# Patient Record
Sex: Male | Born: 1935 | Race: White | Hispanic: No | Marital: Married | Smoking: Never smoker
Health system: Southern US, Community
[De-identification: ages and names within clinical notes are randomized; demographics above are authoritative.]

## PROBLEM LIST (undated history)

## (undated) DIAGNOSIS — R269 Unspecified abnormalities of gait and mobility: Secondary | ICD-10-CM

## (undated) DIAGNOSIS — G309 Alzheimer's disease, unspecified: Secondary | ICD-10-CM

## (undated) DIAGNOSIS — M545 Low back pain, unspecified: Secondary | ICD-10-CM

## (undated) DIAGNOSIS — G2401 Drug induced subacute dyskinesia: Secondary | ICD-10-CM

## (undated) DIAGNOSIS — G40909 Epilepsy, unspecified, not intractable, without status epilepticus: Secondary | ICD-10-CM

## (undated) DIAGNOSIS — M810 Age-related osteoporosis without current pathological fracture: Secondary | ICD-10-CM

## (undated) DIAGNOSIS — S82892A Other fracture of left lower leg, initial encounter for closed fracture: Secondary | ICD-10-CM

## (undated) DIAGNOSIS — Z87898 Personal history of other specified conditions: Secondary | ICD-10-CM

## (undated) DIAGNOSIS — G20A1 Parkinson's disease without dyskinesia, without mention of fluctuations: Secondary | ICD-10-CM

## (undated) DIAGNOSIS — Z8669 Personal history of other diseases of the nervous system and sense organs: Secondary | ICD-10-CM

## (undated) DIAGNOSIS — I639 Cerebral infarction, unspecified: Secondary | ICD-10-CM

## (undated) DIAGNOSIS — S2239XA Fracture of one rib, unspecified side, initial encounter for closed fracture: Secondary | ICD-10-CM

## (undated) DIAGNOSIS — S2249XA Multiple fractures of ribs, unspecified side, initial encounter for closed fracture: Secondary | ICD-10-CM

## (undated) DIAGNOSIS — Z8679 Personal history of other diseases of the circulatory system: Secondary | ICD-10-CM

## (undated) DIAGNOSIS — I10 Essential (primary) hypertension: Secondary | ICD-10-CM

## (undated) DIAGNOSIS — G20 Parkinson's disease: Secondary | ICD-10-CM

## (undated) DIAGNOSIS — F259 Schizoaffective disorder, unspecified: Secondary | ICD-10-CM

## (undated) DIAGNOSIS — G8929 Other chronic pain: Secondary | ICD-10-CM

## (undated) DIAGNOSIS — F0280 Dementia in other diseases classified elsewhere without behavioral disturbance: Secondary | ICD-10-CM

## (undated) DIAGNOSIS — E785 Hyperlipidemia, unspecified: Secondary | ICD-10-CM

## (undated) DIAGNOSIS — I2510 Atherosclerotic heart disease of native coronary artery without angina pectoris: Secondary | ICD-10-CM

## (undated) DIAGNOSIS — M1990 Unspecified osteoarthritis, unspecified site: Secondary | ICD-10-CM

## (undated) HISTORY — DX: Other fracture of left lower leg, initial encounter for closed fracture: S82.892A

## (undated) HISTORY — PX: TRANSURETHRAL RESECTION OF PROSTATE: SHX73

## (undated) HISTORY — DX: Other chronic pain: G89.29

## (undated) HISTORY — PX: ANKLE FRACTURE SURGERY: SHX122

## (undated) HISTORY — DX: Personal history of other diseases of the nervous system and sense organs: Z86.69

## (undated) HISTORY — DX: Hyperlipidemia, unspecified: E78.5

## (undated) HISTORY — PX: CARDIAC SURGERY: SHX584

## (undated) HISTORY — DX: Schizoaffective disorder, unspecified: F25.9

## (undated) HISTORY — DX: Drug induced subacute dyskinesia: G24.01

## (undated) HISTORY — DX: Personal history of other diseases of the circulatory system: Z86.79

## (undated) HISTORY — DX: Atherosclerotic heart disease of native coronary artery without angina pectoris: I25.10

## (undated) HISTORY — DX: Age-related osteoporosis without current pathological fracture: M81.0

## (undated) HISTORY — PX: ODONTOID FRACTURE SURGERY: SHX724

## (undated) HISTORY — DX: Fracture of one rib, unspecified side, initial encounter for closed fracture: S22.39XA

## (undated) HISTORY — PX: HERNIA REPAIR: SHX51

## (undated) HISTORY — DX: Multiple fractures of ribs, unspecified side, initial encounter for closed fracture: S22.49XA

## (undated) HISTORY — DX: Low back pain, unspecified: M54.50

## (undated) HISTORY — PX: GALLBLADDER SURGERY: SHX652

## (undated) HISTORY — DX: Unspecified abnormalities of gait and mobility: R26.9

## (undated) HISTORY — DX: Low back pain: M54.5

## (undated) HISTORY — DX: Personal history of other specified conditions: Z87.898

---

## 2002-04-08 ENCOUNTER — Encounter: Payer: Self-pay | Admitting: Emergency Medicine

## 2002-04-09 ENCOUNTER — Inpatient Hospital Stay (HOSPITAL_COMMUNITY): Admission: EM | Admit: 2002-04-09 | Discharge: 2002-04-10 | Payer: Self-pay | Admitting: Emergency Medicine

## 2005-11-27 ENCOUNTER — Encounter: Admission: RE | Admit: 2005-11-27 | Discharge: 2005-11-27 | Payer: Self-pay | Admitting: Neurology

## 2006-07-20 ENCOUNTER — Encounter: Admission: RE | Admit: 2006-07-20 | Discharge: 2006-07-20 | Payer: Self-pay | Admitting: Neurology

## 2008-02-24 ENCOUNTER — Inpatient Hospital Stay (HOSPITAL_COMMUNITY): Admission: EM | Admit: 2008-02-24 | Discharge: 2008-03-02 | Payer: Self-pay | Admitting: Emergency Medicine

## 2008-04-11 ENCOUNTER — Inpatient Hospital Stay (HOSPITAL_COMMUNITY): Admission: RE | Admit: 2008-04-11 | Discharge: 2008-04-13 | Payer: Self-pay | Admitting: Urology

## 2008-04-11 ENCOUNTER — Encounter (INDEPENDENT_AMBULATORY_CARE_PROVIDER_SITE_OTHER): Payer: Self-pay | Admitting: Urology

## 2009-04-13 IMAGING — CT CT HEAD W/O CM
1 of 2 series · 15 of 30 positions shown, 19 images · non-contrast
Comparison: None.

Addendum Begins

Follow-up MRI on 03/01/2008 demonstrated a small left subdural
hematoma.  In retrospect, there is a small amount of high density
along the left parietal convexity consistent with a small amount of
extra-axial blood.  Dr. Romy Violet Minit discussed this finding with Dr.
Garland following the MRI study.
Addendum Ends
CLINICAL DATA: Confusion and rule out bleed
CT HEAD WITHOUT CONTRAST
TECHNIQUE: Contiguous axial images were obtained from the base of
the skull through the vertex without contrast.

[Series 5: head trauma 2.4 h60s · axial · 0.49mm/px · z∈[-202,-74]mm · 15 of 60 slices shown, 19 images]
[im 4/60  brain]
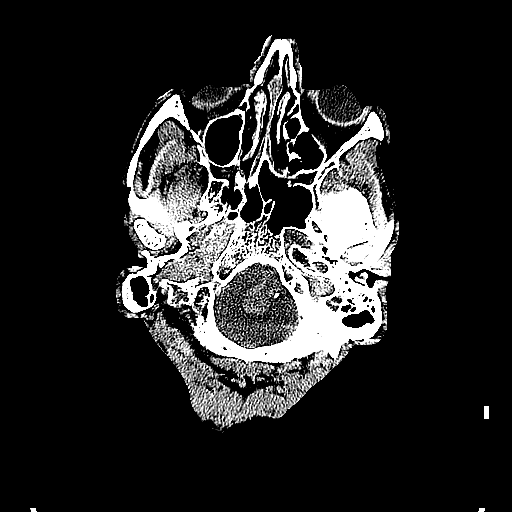
[im 4/60  bone]
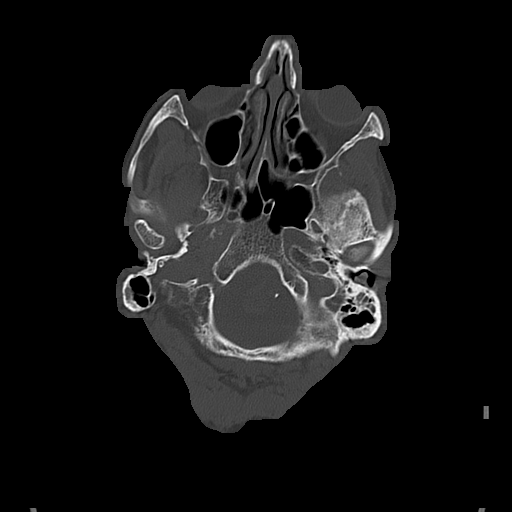
[im 7/60  brain]
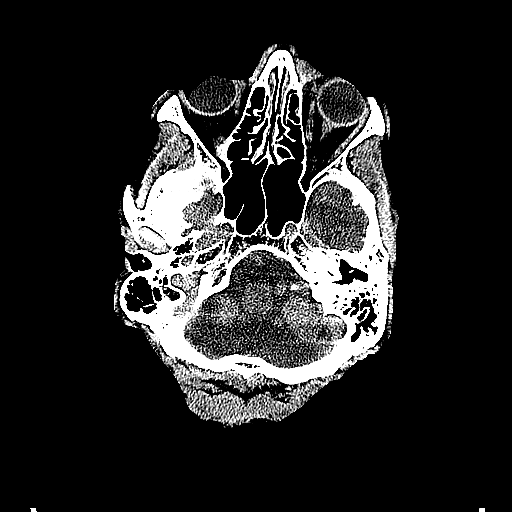
[im 10/60  brain]
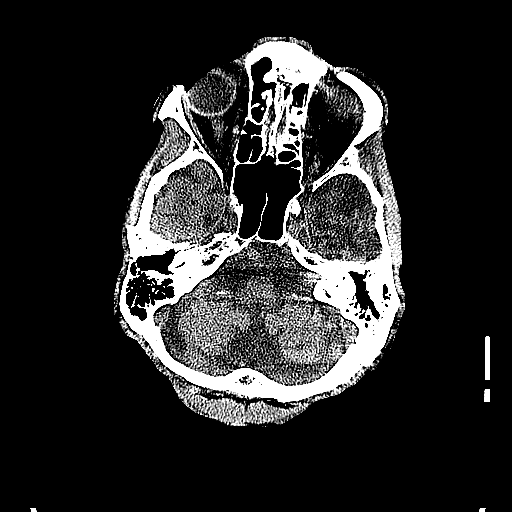
[im 13/60  brain]
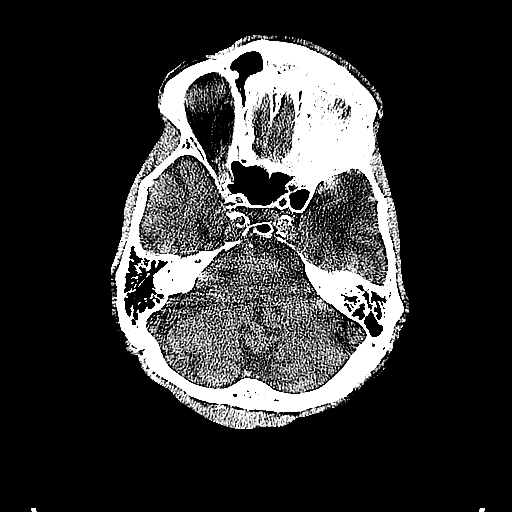
[im 19/60  brain]
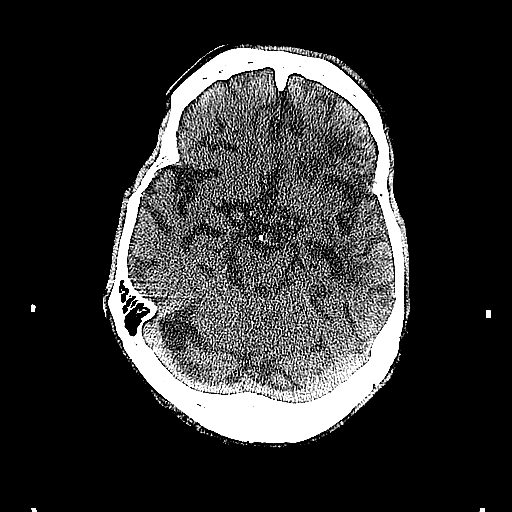
[im 19/60  bone]
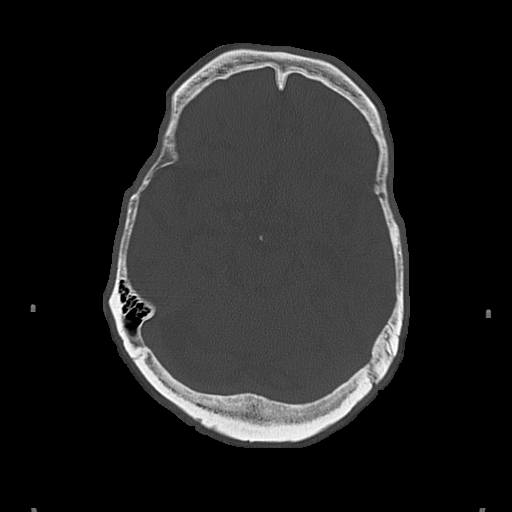
[im 22/60  brain]
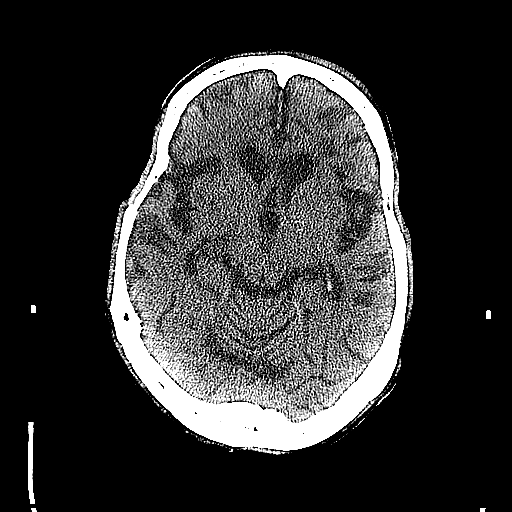
[im 25/60  brain]
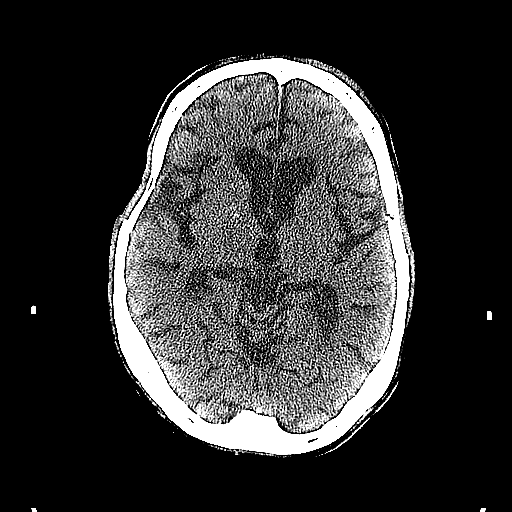
[im 28/60  brain]
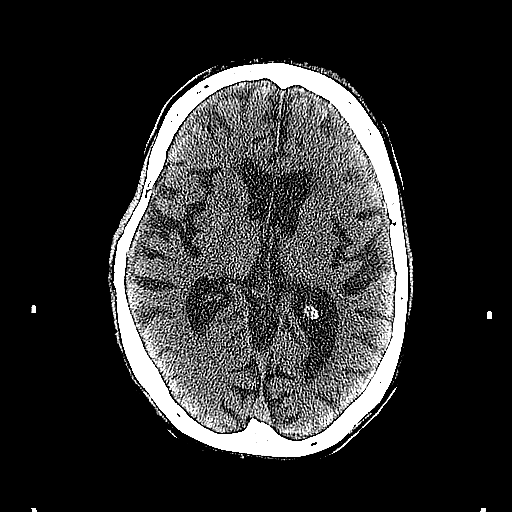
[im 32/60  brain]
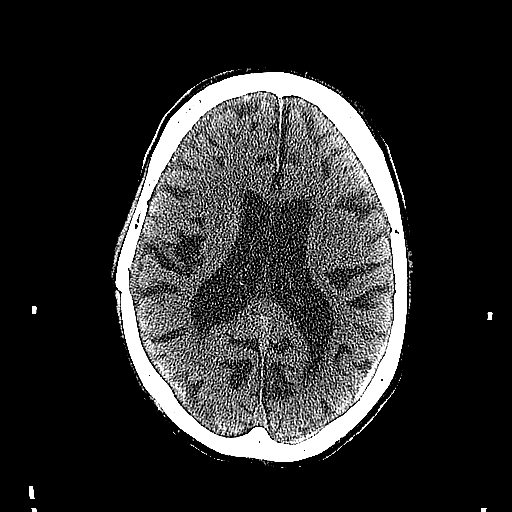
[im 32/60  bone]
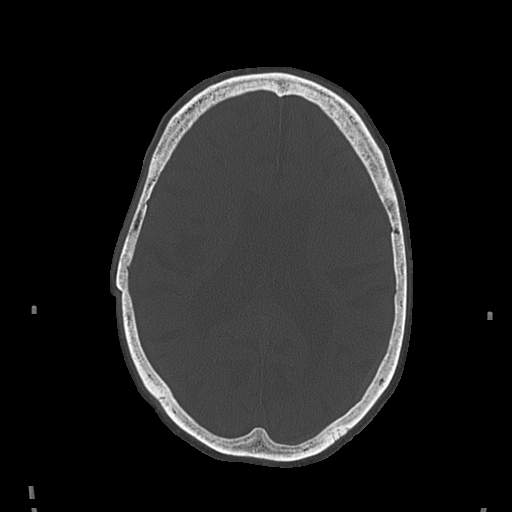
[im 35/60  brain]
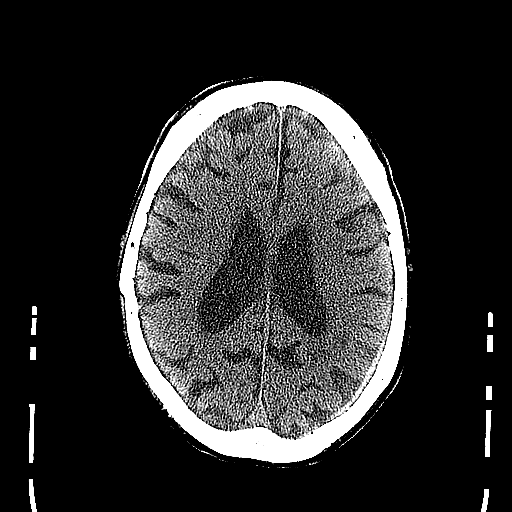
[im 38/60  brain]
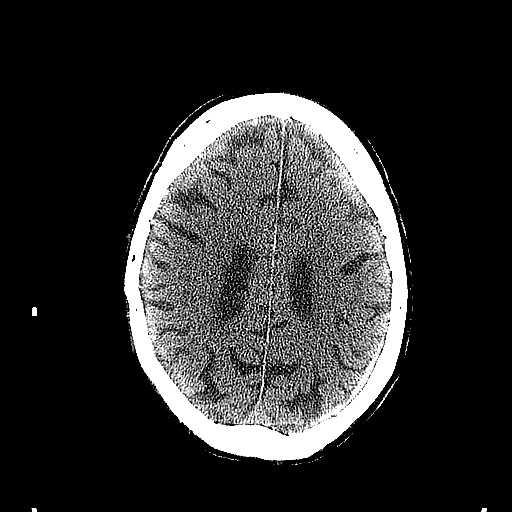
[im 44/60  brain]
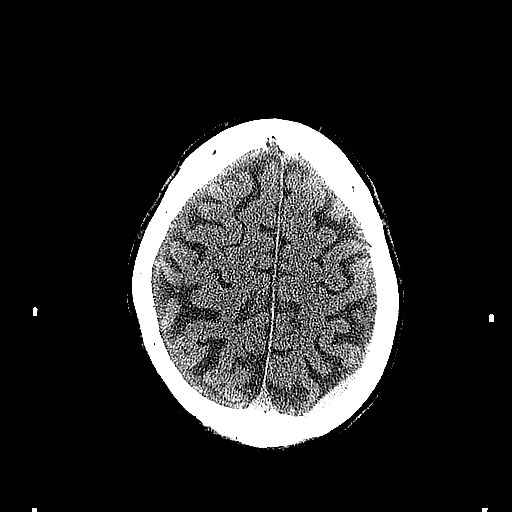
[im 47/60  brain]
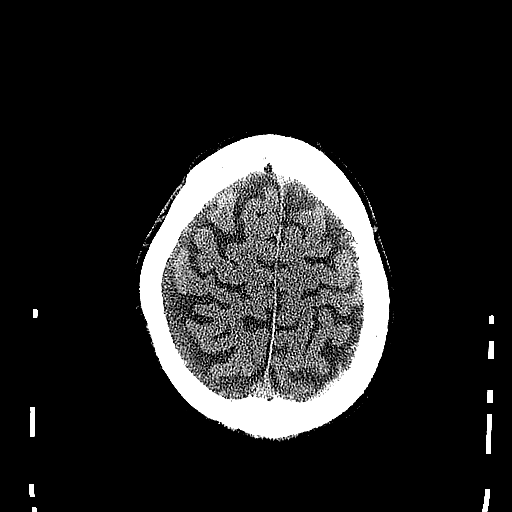
[im 47/60  bone]
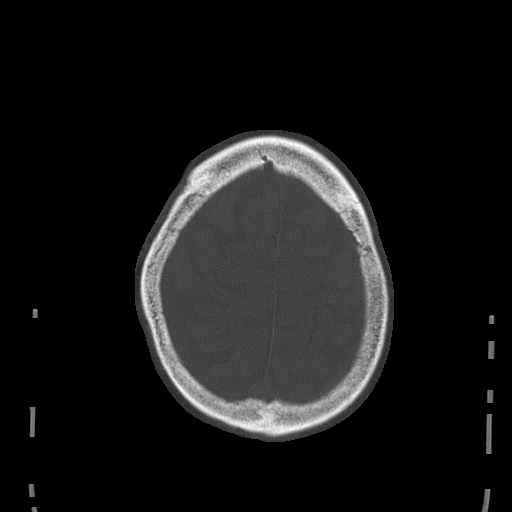
[im 53/60  brain]
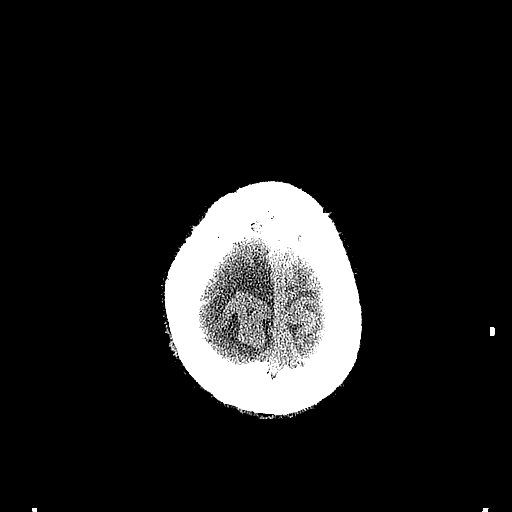
[im 56/60  brain]
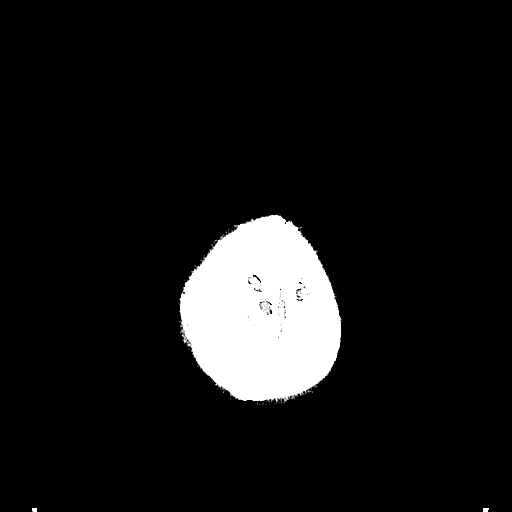

[15 of 30 positions shown; findings below may reference images not displayed]

FINDINGS: Diffuse cerebral atrophy and encephalomalacia in the left
temporal and inferior left frontal lobe.  Soft tissue swelling
along the left forehead.  No evidence for acute hemorrhage, mass
lesion, or midline shift.  Small amount of fluid in the left
mastoid air cells.  No acute calvarial fracture.  Low density along
the anterior limb of the right internal capsule and right corona
radiata may represent an insult of unknown age.
IMPRESSION: Diffuse cerebral atrophy with evidence for encephalomalacia and old
infarct involving the inferior left frontal lobe and the left
temporal lobe.

Negative for acute hemorrhage.

Probable insult involving the right corona radiata of unknown age.

## 2010-09-03 ENCOUNTER — Inpatient Hospital Stay (HOSPITAL_COMMUNITY): Admission: RE | Admit: 2010-09-03 | Discharge: 2010-09-10 | Payer: Self-pay | Admitting: Neurosurgery

## 2011-01-03 ENCOUNTER — Emergency Department (HOSPITAL_COMMUNITY): Payer: Medicare Other

## 2011-01-03 ENCOUNTER — Inpatient Hospital Stay (HOSPITAL_COMMUNITY)
Admission: EM | Admit: 2011-01-03 | Discharge: 2011-01-12 | DRG: 885 | Disposition: A | Discharge status: Home / Self Care | Payer: Medicare Other | Attending: Internal Medicine | Admitting: Internal Medicine

## 2011-01-03 DIAGNOSIS — Z7982 Long term (current) use of aspirin: Secondary | ICD-10-CM

## 2011-01-03 DIAGNOSIS — G40909 Epilepsy, unspecified, not intractable, without status epilepticus: Secondary | ICD-10-CM | POA: Diagnosis present

## 2011-01-03 DIAGNOSIS — M545 Low back pain, unspecified: Secondary | ICD-10-CM | POA: Diagnosis present

## 2011-01-03 DIAGNOSIS — R269 Unspecified abnormalities of gait and mobility: Secondary | ICD-10-CM | POA: Diagnosis present

## 2011-01-03 DIAGNOSIS — F200 Paranoid schizophrenia: Principal | ICD-10-CM | POA: Diagnosis present

## 2011-01-03 DIAGNOSIS — E785 Hyperlipidemia, unspecified: Secondary | ICD-10-CM | POA: Diagnosis present

## 2011-01-03 DIAGNOSIS — F079 Unspecified personality and behavioral disorder due to known physiological condition: Secondary | ICD-10-CM | POA: Diagnosis present

## 2011-01-03 DIAGNOSIS — F0390 Unspecified dementia without behavioral disturbance: Secondary | ICD-10-CM | POA: Diagnosis present

## 2011-01-03 DIAGNOSIS — I672 Cerebral atherosclerosis: Secondary | ICD-10-CM | POA: Diagnosis present

## 2011-01-03 DIAGNOSIS — K219 Gastro-esophageal reflux disease without esophagitis: Secondary | ICD-10-CM | POA: Diagnosis present

## 2011-01-03 DIAGNOSIS — Z951 Presence of aortocoronary bypass graft: Secondary | ICD-10-CM

## 2011-01-03 DIAGNOSIS — Z9849 Cataract extraction status, unspecified eye: Secondary | ICD-10-CM

## 2011-01-03 DIAGNOSIS — N179 Acute kidney failure, unspecified: Secondary | ICD-10-CM | POA: Diagnosis present

## 2011-01-03 DIAGNOSIS — I2510 Atherosclerotic heart disease of native coronary artery without angina pectoris: Secondary | ICD-10-CM | POA: Diagnosis present

## 2011-01-03 DIAGNOSIS — J9819 Other pulmonary collapse: Secondary | ICD-10-CM | POA: Diagnosis present

## 2011-01-03 DIAGNOSIS — E039 Hypothyroidism, unspecified: Secondary | ICD-10-CM | POA: Diagnosis present

## 2011-01-03 DIAGNOSIS — M810 Age-related osteoporosis without current pathological fracture: Secondary | ICD-10-CM | POA: Diagnosis present

## 2011-01-03 DIAGNOSIS — Z8673 Personal history of transient ischemic attack (TIA), and cerebral infarction without residual deficits: Secondary | ICD-10-CM

## 2011-01-03 LAB — CBC
HCT: 38 % — ABNORMAL LOW (ref 39.0–52.0)
Hemoglobin: 12.7 g/dL — ABNORMAL LOW (ref 13.0–17.0)
MCV: 94.3 fL (ref 78.0–100.0)
RBC: 4.03 MIL/uL — ABNORMAL LOW (ref 4.22–5.81)
WBC: 8.9 10*3/uL (ref 4.0–10.5)

## 2011-01-03 LAB — URINALYSIS, ROUTINE W REFLEX MICROSCOPIC
Bilirubin Urine: NEGATIVE
Ketones, ur: NEGATIVE mg/dL
Nitrite: NEGATIVE
Specific Gravity, Urine: 1.019 (ref 1.005–1.030)
Urobilinogen, UA: 1 mg/dL (ref 0.0–1.0)

## 2011-01-03 LAB — COMPREHENSIVE METABOLIC PANEL
BUN: 19 mg/dL (ref 6–23)
Calcium: 8.7 mg/dL (ref 8.4–10.5)
GFR calc non Af Amer: 53 mL/min — ABNORMAL LOW (ref >60)
Glucose, Bld: 89 mg/dL (ref 70–99)
Total Protein: 6.9 g/dL (ref 6.0–8.3)

## 2011-01-03 LAB — DIFFERENTIAL
Eosinophils Relative: 2 % (ref 0–5)
Lymphocytes Relative: 27 % (ref 12–46)
Lymphs Abs: 2.4 10*3/uL (ref 0.7–4.0)
Neutrophils Relative %: 64 % (ref 43–77)

## 2011-01-03 LAB — POCT CARDIAC MARKERS
CKMB, poc: 5.1 ng/mL (ref 1.0–8.0)
Troponin i, poc: <0.05 ng/mL (ref 0.00–0.09)

## 2011-01-03 LAB — PROTIME-INR
INR: 1.01 (ref 0.00–1.49)
Prothrombin Time: 13.5 seconds (ref 11.6–15.2)

## 2011-01-03 LAB — TROPONIN I: Troponin I: <0.01 ng/mL (ref 0.00–0.06)

## 2011-01-03 LAB — GLUCOSE, CAPILLARY: Glucose-Capillary: 88 mg/dL (ref 70–99)

## 2011-01-03 LAB — ETHANOL: Alcohol, Ethyl (B): <5 mg/dL (ref 0–10)

## 2011-01-03 LAB — CK TOTAL AND CKMB (NOT AT ARMC)
Relative Index: 1.8 (ref 0.0–2.5)
Total CK: 387 U/L — ABNORMAL HIGH (ref 7–232)

## 2011-01-04 DIAGNOSIS — F200 Paranoid schizophrenia: Secondary | ICD-10-CM

## 2011-01-04 LAB — CBC
MCH: 31.1 pg (ref 26.0–34.0)
Platelets: 205 10*3/uL (ref 150–400)
RBC: 3.92 MIL/uL — ABNORMAL LOW (ref 4.22–5.81)
WBC: 8.2 10*3/uL (ref 4.0–10.5)

## 2011-01-04 LAB — BASIC METABOLIC PANEL
CO2: 27 mEq/L (ref 19–32)
Chloride: 104 mEq/L (ref 96–112)
Creatinine, Ser: 0.91 mg/dL (ref 0.4–1.5)
GFR calc Af Amer: >60 mL/min (ref >60)
Glucose, Bld: 83 mg/dL (ref 70–99)
Potassium: 4.1 mEq/L (ref 3.5–5.1)
Potassium: 4.2 mEq/L (ref 3.5–5.1)
Sodium: 142 mEq/L (ref 135–145)

## 2011-01-04 LAB — AMMONIA: Ammonia: 21 umol/L (ref 11–35)

## 2011-01-04 LAB — CARDIAC PANEL(CRET KIN+CKTOT+MB+TROPI)
CK, MB: 4.7 ng/mL — ABNORMAL HIGH (ref 0.3–4.0)
Relative Index: 1.8 (ref 0.0–2.5)
Total CK: 236 U/L — ABNORMAL HIGH (ref 7–232)
Troponin I: 0.01 ng/mL (ref 0.00–0.06)

## 2011-01-04 LAB — MAGNESIUM: Magnesium: 2.1 mg/dL (ref 1.5–2.5)

## 2011-01-04 NOTE — H&P (Signed)
NAME:  Carl Hamilton, MILLETT NO.:  [id number]  MEDICAL RECORD NO.:  [id number]           PATIENT TYPE:  E  LOCATION:  [facility]                         FACILITY:  [facility]  PHYSICIAN:  Hilary Hertz, MD      DATE OF BIRTH:  1935-09-04  DATE OF ADMISSION:  01/03/2011                             HISTORY & PHYSICAL   PRIMARY CARE PHYSICIAN:  Dr. Stephanie Acre.  CHIEF COMPLAINT:  Altered mental status.  HISTORY OF PRESENT ILLNESS:  The patient is a 76 year old man with a history of dementia, schizophrenia, prior CVA, and prior subdural hematoma who presents to the emergency room as he is brought in by family for altered mental status.  At this time, the patient's family members are not in the room and they have not been able to be reached by phone, so limited history is able to be obtained.  Per the note, it sounds like the patient had an episode of altered mental status this morning, but then had continued to improve.  However, the family brought him in because they were concerned about the episode that he had this morning.  It is unclear what the patient's baseline mentation is at this time.  He has no complaints and he is just asking for a softer pillow to lay his head on.  He denies any pain.  He denies chest pain, shortness of breath, or any other specific complaints.  REVIEW OF SYSTEMS:  Unable to be obtain due to the patient's mental status.  ALLERGIES:  No known allergies per pharmacy med reconciliation.  The patient's home medications per pharmacy med reconciliation include: 1. Donepezil 10 mg daily. 2. Trazodone 75 mg daily at bedtime. 3. Ferrous sulfate 325 twice a day. 4. Docusate 100 mg twice a day as needed. 5. Aggrenox daily at bedtime. 6. Vitamin D3 twice daily. 7. Levothyroxine 75 mcg daily.  I am unable to verify these medications with the patient at this time. He does not know his medications and there are no family members in the room.  PAST  MEDICAL HISTORY:  Multiple prior histories of altered mental status in the setting of dementia, urinary retention, dementia, schizophrenia, seizure disorder, cerebrovascular disease, left frontotemporal stroke, low back pain, compression fracture of the spine, subdural hematoma, CAD status post CABG, osteoporosis, spinal stenosis, type 2 odontoid fracture, hyperlipidemia, and history of hernia surgery.  SOCIAL HISTORY:  Unable to obtain due to the patient's mental status.  FAMILY HISTORY:  Unable to be obtained due to the patient's mental status.  PHYSICAL EXAMINATION:  VITAL SIGNS:  Blood pressure 129/69, heart rate 66, respiratory rate 20, and temperature 98.3. GENERAL:  No acute distress. HEENT:  Mucous membranes are moist.  Sclerae are anicteric. CARDIOVASCULAR:  Regular rate and rhythm.  No murmurs, rubs, or gallops. LUNGS:  Clear to auscultation bilaterally.  Nonlabored breathing. ABDOMEN:  Soft, nontender, and nondistended. EXTREMITIES:  No edema. NEURO:  Nonfocal.  The patient is alert and oriented to name only.  He says that he is at [facility] in [place] and initially said the year was 38 and then 2009 and then eventually  was able to say 2012, although he was unable to provide date.  LABORATORY DATA:  White count 8.9, hemoglobin 12.7, and platelets 212. Lactic acid 0.8.  Troponin less than 0.05, CK 387, and CK-MB 7.0.  INR 1.0.  EtOH less than 5.  Troponin less than 0.01.  Sodium 137, potassium 4.3, chloride 105, bicarb 23, BUN 19, creatinine 1.33, and glucose 89. Last creatinine in 2011 level was 0.83.  UA shows no signs of infection. Head CT, no acute changes.  Chest x-ray with questionable pneumonia. EKG, sinus rhythm, rate of 61 with nonspecific ST changes.  ASSESSMENT AND PLAN:  The patient is a 76 year old man with a history of dementia, schizophrenia, prior stroke, coronary artery disease status post coronary artery bypass grafting, and seizure disorder  who presents with an episode of altered mental status today. 1. Altered mental status, unclear etiology at this point, resolved?:  I guess it     is possible that the patient could have had a seizure.  However, he     is not taking any medications for seizures at this time and it is     unclear why; we will need to clarify with the patient's neurologist.     This could have also been TIA, although lower on my     differential versus maybe delirium from an infectious process     versus nonspecific generalized process such as cardiac ischemia     versus other.  At this time, the patient's head CT does not show     any acute changes concerning for stroke.  We will do an infectious     workup.  We will monitor the patient on tele, check cardiac     enzymes.  His first troponins are negative, however, his CK and CK-     MB are elevated.  I am not sure how to interpret this in the absence     of any symptoms and nonspecific EKG changes, but we will continue     to monitor.  Can consider getting a neurology consult in the morning     for an EEG versus potential stroke workup versus evaluation for     need for seizure medications.  We will call Neurology in the     morning. 2. Acute kidney injury:  The patient's last creatinine was 0.83 one     year ago.  It is now 1.33.  I guess this could also be contributing to     his mental status.  More likely, this would be a result of him     being altered and not eating or drinking properly.  We will do a     fluid challenge and recheck his creatinine in the morning.     Continue to monitor. 3. Prolonged QT:  His last QTc 1 year ago was 444, today is 471.  We     will have Pharmacy review his medication list for QT-prolonging     meds and avoid QT-prolonging meds in the future. 4. Questionable pneumonia:  The patient's chest x-ray shows an opacity     at the left heart border which may represent atelectasis.  At this     time, the patient does not have  a white count and he does not     complain of cough or fevers at home, so it is difficult to say     if this is truly a pneumonia in this context.  I think it is  reasonable to monitor the patient for now and as stated above we     will complete an infectious workup with blood cultures and urine     cultures.  If the rest of the workup is negative, can presume that     this is a potential pneumonia that would need to be treated and     could potentially be the cause of the patient's mental status. 5. Dementia:  We will continue the patient's home donepezil and     trazodone. 6. Old cerebrovascular accident:  Continue the patient's Aggrenox. 7. Hypothyroidism:  Continue the patient's levothyroxine.  We will     check a TSH. 8. We will need to clarify the patient's home medications with family     members in the morning.  I have attempted to call the patient's     phone number in the chart twice today and no one has answered the     phone.  The med reconciliation was completed by Pharmacy, however,     it seems that the patient has had multiple recent medication     changes done recently, so I would like to clarify that. 9. Fluid, electrolytes, nutrition:  Fluid challenge as above.  Replete     electrolytes as needed.  We will put the patient on heart-healthy     diet. 10.Prophylaxis:  Lovenox was renally adjusted for DVT prophylaxis. 11.Disposition:  At this time, the patient is presumed a full code.     We will need to clarify further with the family in the morning.          ______________________________ Hilary Hertz, MD     JF/MEDQ  D:  01/03/2011  T:  01/03/2011  Job:  [id number]  Electronically Signed by Hilary Hertz MD on 01/04/2011 03:17:02 AM

## 2011-01-05 LAB — CBC
MCH: 30.8 pg (ref 26.0–34.0)
MCV: 91.5 fL (ref 78.0–100.0)
Platelets: 209 10*3/uL (ref 150–400)
RBC: 3.9 MIL/uL — ABNORMAL LOW (ref 4.22–5.81)

## 2011-01-05 LAB — BASIC METABOLIC PANEL
BUN: 13 mg/dL (ref 6–23)
CO2: 26 mEq/L (ref 19–32)
Chloride: 103 mEq/L (ref 96–112)
Creatinine, Ser: 0.96 mg/dL (ref 0.4–1.5)

## 2011-01-06 ENCOUNTER — Inpatient Hospital Stay (HOSPITAL_COMMUNITY): Payer: Medicare Other

## 2011-01-06 DIAGNOSIS — F200 Paranoid schizophrenia: Secondary | ICD-10-CM
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
## 2011-01-07 DIAGNOSIS — F200 Paranoid schizophrenia: Secondary | ICD-10-CM

## 2011-01-08 DIAGNOSIS — F259 Schizoaffective disorder, unspecified: Secondary | ICD-10-CM

## 2011-01-09 DIAGNOSIS — F259 Schizoaffective disorder, unspecified: Secondary | ICD-10-CM

## 2011-01-09 LAB — GLUCOSE, CAPILLARY: Glucose-Capillary: 100 mg/dL — ABNORMAL HIGH (ref 70–99)

## 2011-01-10 LAB — CULTURE, BLOOD (ROUTINE X 2)
Culture  Setup Time: 201202120115
Culture  Setup Time: 201202121127
Culture: NO GROWTH
Culture: NO GROWTH

## 2011-01-11 NOTE — Progress Notes (Signed)
  NAME:  JOREN, REHM NO.:  [id number]  MEDICAL RECORD NO.:  [id number]           PATIENT TYPE:  I  LOCATION:  4504                         FACILITY:  [facility]  PHYSICIAN:  Eulogio Ditch, MD DATE OF BIRTH:  [birth year]                                PROGRESS NOTE   A 77 year old male with a history of dementia and schizophrenia who was admitted because of agitation and confusion.  I spoke with the wife and she told me that the patient tries to go to the busy street and he also wanders at home.  She told me that he was in his underwear when he was outside the home.  The patient's wife told me that the patient's condition is worsening for the last few weeks and it is becoming difficult to handle him at home.  The patient is still confused, internally preoccupied, agitated on and off.  This patient will get benefit by going to [facility] psych unit for further observation and medical stabilization.  DIAGNOSES: Schizophrenia, paranoid type; dementia, not otherwise specified.  MEDICATIONS: We will continue the current treatment with Celexa, Lamictal, Klonopin, Abilify, and Namenda.     Eulogio Ditch, MD     SA/MEDQ  D:  01/07/2011  T:  01/07/2011  Job:  [id number]  Electronically Signed by Eulogio Ditch  on 01/11/2011 06:23:30 AM
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
## 2011-01-12 DIAGNOSIS — F259 Schizoaffective disorder, unspecified: Secondary | ICD-10-CM

## 2011-01-12 NOTE — Discharge Summary (Signed)
NAME:  Carl Hamilton, Carl Hamilton NO.:  [id number]  MEDICAL RECORD NO.:  [id number]           PATIENT TYPE:  I  LOCATION:  4504                         FACILITY:  [facility]  PHYSICIAN:  Jeoffrey Massed, MD    DATE OF BIRTH:  09-01-1935  DATE OF ADMISSION:  01/03/2011 DATE OF DISCHARGE:  01/12/2011                        DISCHARGE SUMMARY - REFERRING   PRIMARY CARE PRACTITIONER:  Dr. Foye Deer in [place].  PRIMARY NEUROLOGIST:  Dr. Lesia Sago of [facility].  PRIMARY DISCHARGE DIAGNOSES: 1. Altered mental status, now back to his baseline. 2. History of paranoid schizophrenia.  SECONDARY DISCHARGE DIAGNOSES: 1. History of seizure disorder. 2. Dementia. 3. Dyslipidemia. 4. Organic brain syndrome. 5. History of cerebrovascular accident. 6. History of subdural hematoma. 7. History of coronary artery disease status post coronary artery     bypass graft. 8. History of gait disorder. 9. History of vertebral compression fractures. 10.History of osteoporosis. 11.History of cataract surgery. 12.History of cholecystectomy. 13.History of strabismus. 14.Status post transurethral resection of prostate. 15.History of tardive dyskinesias. 16.History of intermittent and chronic headaches.  DISCHARGE MEDICATIONS: 1. Abilify 10 mg 1 tablet p.o. twice daily. 2. Protonix 40 mg 1 tablet p.o. daily. 3. MiraLax 17 g 1 tablet p.o. daily. 4. Lyrica 200 mg 1 tablet p.o. at bedtime. 5. Lyrica 100 mg 1 tablet p.o. daily. 6. Senokot 2 tablets p.o. at bedtime. 7. Alendronate 70 mg 1 tablet p.o. every Sunday. 8. Aspirin 81 mg 1 tablet p.o. daily. 9. Clonazepam 0.5 mg 1 tablet p.o. twice daily. 10.Lamotrigine 200 mg 1 tablet p.o. twice daily. 11.Lipitor 40 mg 1 tablet p.o. at bedtime. 12.Namenda 10 mg 1 tablet p.o. twice daily. 13.Trazodone 50 mg 1 tablet p.o. at bedtime. 14.Zetia 10 mg 1 tablet p.o. daily.  CONSULTATIONS: 1. Dr. Rogers Blocker from  Psychiatry. 2. Dr. Anne Hahn from [facility].  BRIEF HISTORY OF PRESENT ILLNESS:  The patient is a 76 year old male with a history of schizophrenia, paranoid type; dementia; and numerous other medical issues mentioned above, was brought to the hospital on January 03, 2011 for altered mental status.  Apparently, this patient has had episodes where he has become irritable, obstreperous, and has a tendency to wander even out of the house.  Apparently, he has had events where he apparently has walked around the house naked and has attempted to get outside.  In any event, it was difficult to manage this patient at home and the patient was then brought to the emergency room for evaluation and was then admitted to the hospitalist service for further evaluation and treatment.  For further details, please see the history and physical that was dictated by Dr. Lurline Idol on admission.  PERTINENT LABORATORY DATA: 1. CT of the head shows no acute intracranial hemorrhage or CT     evidence of large infarct.  Remote infarction small vessel disease     type changes are noted as above. 2. X-ray of the chest shows calcified tortuous aorta.  Mild central     pulmonary vascular prominence.  PERTINENT LABORATORY DATA: 1. Blood cultures done on January 04, 2011 are negative. 2. Blood cultures done on  January 03, 2011 were negative. 3. Urine cultures done on the January 03, 2011 shows 40,000 colonies     of multiple bacterial morphotypes. 4. Vitamin B12 was 483. 5. TSH is 1.948. 6. Ammonia level was 21.  BRIEF HOSPITAL COURSE: 1. Altered mental status.  Initially the patient was admitted to the     hospitalist unit thinking there might be another etiology apart     from his psychiatric issues for his altered mental status.     Neurology was also consulted.  An EEG was done during this     hospitalization which showed mild slowing emanating from the left     hemisphere.  Rare sharp transients are  seen suggesting lowered     seizure threshold but no EEG evidence of seizures were evaluated.     He had no signs or symptoms of infection to suggest that this     altered mental status was coming from his infectious issues.  Blood     and urine cultures were negative.  His chest x-ray did not show any     pneumonia.  Ultimately, it was felt that this was probably a flare     of his psychiatric issues.  Psychiatry was also consulted.  His     medications have been adjusted as above.  During his     hospitalization, he has had no behavioral issues.  He has been     followed by Psychiatry numerous times during this hospitalization     and they have recommended that this patient be transferred to a     geri psych unit when a bed is available.  PLAN: 1. History of seizures.  The patient is maintained on Lamictal.  EEG     is noted as above. 2. Gastroesophageal reflux disease.  The patient is maintained on     Protonix. 3. Dyslipidemia.  The patient is maintained on Lipitor. 4. Dementia.  The patient is maintained on Namenda.  DISPOSITION:  The patient will be transferred to a geri psych facility when a bed is available.  FOLLOWUP INSTRUCTIONS: 1. The patient will follow up with his primary care practitioner 1-2     weeks upon discharge from the [facility] psych unit. 2. The patient will also need to follow up with his primary     neurologist, Dr. Anne Hahn within 1-2 weeks upon discharge from the     geri psych unit.     Jeoffrey Massed, MD     SG/MEDQ  D:  01/11/2011  T:  01/11/2011  Job:  [id number]  Electronically Signed by Jeoffrey Massed  on 01/12/2011 04:32:37 PM

## 2011-02-04 LAB — BASIC METABOLIC PANEL
BUN: 11 mg/dL (ref 6–23)
BUN: 7 mg/dL (ref 6–23)
CO2: 29 mEq/L (ref 19–32)
CO2: 29 mEq/L (ref 19–32)
Calcium: 8.4 mg/dL (ref 8.4–10.5)
Calcium: 8.7 mg/dL (ref 8.4–10.5)
Chloride: 95 mEq/L — ABNORMAL LOW (ref 96–112)
Chloride: 96 mEq/L (ref 96–112)
Creatinine, Ser: 0.68 mg/dL (ref 0.4–1.5)
Creatinine, Ser: 0.68 mg/dL (ref 0.4–1.5)
GFR calc Af Amer: >60 mL/min (ref >60)
GFR calc Af Amer: >60 mL/min (ref >60)
GFR calc non Af Amer: >60 mL/min (ref >60)
GFR calc non Af Amer: >60 mL/min (ref >60)
GFR calc non Af Amer: >60 mL/min (ref >60)
Glucose, Bld: 101 mg/dL — ABNORMAL HIGH (ref 70–99)
Glucose, Bld: 115 mg/dL — ABNORMAL HIGH (ref 70–99)
Potassium: 4 mEq/L (ref 3.5–5.1)
Potassium: 4.2 mEq/L (ref 3.5–5.1)
Potassium: 4.8 mEq/L (ref 3.5–5.1)
Sodium: 129 mEq/L — ABNORMAL LOW (ref 135–145)
Sodium: 132 mEq/L — ABNORMAL LOW (ref 135–145)
Sodium: 136 mEq/L (ref 135–145)

## 2011-02-04 LAB — CBC
HCT: 33.5 % — ABNORMAL LOW (ref 39.0–52.0)
HCT: 36.6 % — ABNORMAL LOW (ref 39.0–52.0)
Hemoglobin: 11.4 g/dL — ABNORMAL LOW (ref 13.0–17.0)
Hemoglobin: 12.3 g/dL — ABNORMAL LOW (ref 13.0–17.0)
MCH: 31.3 pg (ref 26.0–34.0)
MCH: 32.1 pg (ref 26.0–34.0)
MCHC: 33.6 g/dL (ref 30.0–36.0)
MCHC: 34 g/dL (ref 30.0–36.0)
MCV: 93.1 fL (ref 78.0–100.0)
MCV: 94.4 fL (ref 78.0–100.0)
Platelets: 200 10*3/uL (ref 150–400)
Platelets: 356 K/uL (ref 150–400)
RBC: 3.55 MIL/uL — ABNORMAL LOW (ref 4.22–5.81)
RBC: 3.93 MIL/uL — ABNORMAL LOW (ref 4.22–5.81)
RDW: 13 % (ref 11.5–15.5)
RDW: 13.1 % (ref 11.5–15.5)
WBC: 12.7 10*3/uL — ABNORMAL HIGH (ref 4.0–10.5)
WBC: 9.7 K/uL (ref 4.0–10.5)

## 2011-02-04 LAB — DIFFERENTIAL
Basophils Absolute: 0 10*3/uL (ref 0.0–0.1)
Basophils Absolute: 0 K/uL (ref 0.0–0.1)
Basophils Relative: 0 % (ref 0–1)
Basophils Relative: 0 % (ref 0–1)
Eosinophils Absolute: 0.1 10*3/uL (ref 0.0–0.7)
Eosinophils Absolute: 0.3 K/uL (ref 0.0–0.7)
Eosinophils Relative: 1 % (ref 0–5)
Eosinophils Relative: 3 % (ref 0–5)
Lymphocytes Relative: 17 % (ref 12–46)
Lymphocytes Relative: 9 % — ABNORMAL LOW (ref 12–46)
Lymphs Abs: 1.1 10*3/uL (ref 0.7–4.0)
Lymphs Abs: 1.7 K/uL (ref 0.7–4.0)
Monocytes Absolute: 1.3 10*3/uL — ABNORMAL HIGH (ref 0.1–1.0)
Monocytes Absolute: 1.4 K/uL — ABNORMAL HIGH (ref 0.1–1.0)
Monocytes Relative: 10 % (ref 3–12)
Monocytes Relative: 14 % — ABNORMAL HIGH (ref 3–12)
Neutro Abs: 10.2 10*3/uL — ABNORMAL HIGH (ref 1.7–7.7)
Neutro Abs: 6.3 K/uL (ref 1.7–7.7)
Neutrophils Relative %: 65 % (ref 43–77)
Neutrophils Relative %: 81 % — ABNORMAL HIGH (ref 43–77)

## 2011-02-04 LAB — URINE CULTURE
Colony Count: NO GROWTH
Culture  Setup Time: 201110181722

## 2011-02-05 LAB — SURGICAL PCR SCREEN: MRSA, PCR: NEGATIVE

## 2011-02-05 LAB — CBC
HCT: 38.4 % — ABNORMAL LOW (ref 39.0–52.0)
Platelets: 213 10*3/uL (ref 150–400)
RBC: 3.99 MIL/uL — ABNORMAL LOW (ref 4.22–5.81)
RDW: 13.8 % (ref 11.5–15.5)
WBC: 8 10*3/uL (ref 4.0–10.5)

## 2011-02-05 LAB — BASIC METABOLIC PANEL
BUN: 14 mg/dL (ref 6–23)
Chloride: 105 mEq/L (ref 96–112)
GFR calc Af Amer: >60 mL/min (ref >60)
GFR calc non Af Amer: >60 mL/min (ref >60)
Potassium: 4.6 mEq/L (ref 3.5–5.1)
Sodium: 142 mEq/L (ref 135–145)

## 2011-02-05 LAB — MRSA PCR SCREENING: MRSA by PCR: NEGATIVE

## 2011-04-07 NOTE — Discharge Summary (Signed)
NAME:  KAYLAN, FRIEDMANN NO.:  [id number]   MEDICAL RECORD NO.:  [id number]          PATIENT TYPE:  INP   LOCATION:  5151                         FACILITY:  [facility]   PHYSICIAN:  Marcellus Scott, MD     DATE OF BIRTH:  1934-12-16   DATE OF ADMISSION:  02/24/2008  DATE OF DISCHARGE:  03/02/2008                               DISCHARGE SUMMARY   PRIMARY MEDICAL DOCTOR:  Foye Deer, [place] in [place]   PRIMARY NEUROLOGIST:  Lesia Sago, MD.   DISCHARGE DIAGNOSES:  1. Altered mental status secondary to possible hepatic encephalopathy      from Depakote complicating underlying dementia, organic brain      syndrome and schizophrenia--almost resolved.  2. Small subdural hematoma--follow up clinically and with a repeat      head computed tomography in 10 days.  3. Seizure disorder--Depakote discontinued.  4. Urinary retention, status post Foley which was discontinued.  5. Elevated prostate-specific antigen, question secondary to Foley      catheter trauma versus rule out cancer of prostate.  6. Anemia.  7. Coronary artery disease, status post coronary artery bypass graft.  8. Status post fall.  9. Thrombocytosis for outpatient followup with repeat CBCs.   DISCHARGE MEDICATIONS:  1. Dilantin 300 mg p.o. q.h.s.  2. Lipitor 10 mg p.o. daily.  3. Enteric-coated aspirin 81 mg p.o. daily.  4. Fosamax 70 mg p.o. q. weekly on Sundays.  5. Lyrica 100 mg p.o. twice daily.  6. Nexium 40 mg p.o. q.h.s.  7. Calcium 600 plus vitamin D 1 p.o. twice daily.  8. MiraLax 17 gm in 8 oz of liquids p.o. daily.  9. Senokot S 1 p.o. q.h.s.  10.Flomax 0.8 mg p.o. q.h.s.  11.Trazodone 50 mg p.o. q.h.s.   PROCEDURES:  1. EEG impression:  This is a normal EEG recording in the awake and      sleeping state.  No evidence of ictal or interictal discharges.  2. MRI of the head impression:  Left to right occipital regions, small      subdural hematoma.  No acute infarct.   Prominent atrophy.      Encephalomalacia left frontal and temporal lobe anteriorly.  This      may be related to prior trauma or infarct.  3. MRA of the head impression:  Mild intracranial atherosclerotic-type      changes.  4. CT of the head without contrast impression:  Diffuse cerebral      atrophy with evidence for encephalomalacia and old infarct      involving the inferior left frontal lobe and the left temporal      lobe.  5. Chest x-ray impression:  Borderline heart size, bibasilar      atelectasis.   LABORATORY DATA:  Ammonia today is 26, peak was 72, basic metabolic  panel unremarkable with BUN 9, creatinine 0.61, RPR nonreactive, TSH  3.374.  Hepatic panel was unremarkable except for total protein 5.9 and  albumin 2.5.  CBC:  Hemoglobin 12.4, hematocrit 36.3, white blood cell  11.1, platelets 3633.  Prostate-specific antigen 7.63.  Urine culture:  No growth.  Serum dilantin level 12.1.  Serum valproic level 55.8.   CONSULTATIONS:  1. Urology, Dr. Larey Dresser.  2. Neurology, Dr. Lesia Sago.   HOSPITAL COURSE/PATIENT DISPOSITION:  Please refer to the history and  physical note for initial admission details.  In summary, Mr. Marsico  is a 76 year old Caucasian male patient with a history of organic brain  syndrome, schizophrenia, seizure disorder, dementia, who was admitted  with history of urinary retention.  He was initially evaluated in the  [facility].  Foley catheter was placed and he was seen by an  urologist in [place].  He was advised to keep the Foley catheter until  March 06, 2008.  The patient subsequently dislodged the catheter a  couple of times and presented with hematuria.  He was brought to the  emergency room where a catheter was inserted.  The patient became  fidgety and was sedated with Ativan.  According to his wife, the patient  had significant change of mental status from his baseline for a week or  two prior to admission with decreased  appetite and sleeping, more and  more and confusion.  He was admitted to the hospital for further  evaluation and management.   1. Altered mental status.  This was thought to be multifactorial, but      initially attributed to urinary retention and possible UTI.  The      patient was admitted to the hospital.  His urine cultures were sent      off and he was placed on empiric antibiotics of IV ceftriaxone.      His urine cultures returned negative and his ceftriaxone was      discontinued.  Extensive workup was then done which revealed      ammonia to be elevated.  The patient's Depakote was held and      discontinued.  He was briefly started on lactulose.  With      improvement in his ammonia level, the patient's mental status has      also cleared and is almost at baseline according to his      neurologist.  Dr. Anne Hahn did see him and also requested an MRI      which showed a tiny subdural hematoma which Dr. Anne Hahn indicates      does not require surgery, but has advised to follow up clinically      and with a repeat head CT in 2 weeks.  He is happy to see the      patient in his office in 2 weeks' time.  He has also recommended      continuing his aspirin for his coronary artery disease.  His      Depakote is discontinued.  The altered mental status was probably      secondary to hepatic encephalopathy from his Depakote complicating      underlying dementia, organic brain syndrome and schizophrenia.  2. Urinary retention.  Dr. Vonita Moss was invited in consult.  The      patient was placed on the Foley catheter.  His Flomax was      continued, but Proscar was discontinued.  Subsequently, the Foley      catheter was discontinued and the patient had voided good volumes,      but still had increased post-void residuals.  His Flomax was      thereby increased to 2 tablets daily.  His post-void residuals are  in the 300 mL range.  Urology has cleared him for discharge and he      has to  see Dr. Vonita Moss in his office in a weeks' time.  He may      require TURP procedure subsequently.  3. Seizure disorder.  No clinical or EEG evidence of seizures in the      hospital.  The patient is to remain only on dilantin.  4. Small subdural hematoma.  Management as above.  5. Elevated prostate-specific antigen which may be secondary to Foley      trauma, but to consider ruling out prostate      cancer.  This is to be evaluated by the urologist as an outpatient.  6. Status post fall.  Once PT has evaluated the patient and ensure      that he has good stability with his walker, the patient may be able      to return home today with 24 hour supervision.      Marcellus Scott, MD  Electronically Signed     AH/MEDQ  D:  03/02/2008  T:  03/02/2008  Job:  [id number]   cc:   Foye Deer, MD  C. Lesia Sago, M.D.  Maretta Bees. Vonita Moss, M.D.

## 2011-04-07 NOTE — Consult Note (Signed)
NAME:  MCKYLE, SOLANKI NO.:  [id number]   MEDICAL RECORD NO.:  [id number]          PATIENT TYPE:  INP   LOCATION:  5151                         FACILITY:  [facility]   PHYSICIAN:  Marlan Palau, M.D.  DATE OF BIRTH:  03-25-1935   DATE OF CONSULTATION:  03/01/2008  DATE OF DISCHARGE:                                 CONSULTATION   HISTORY OF PRESENT ILLNESS:  Hannah Crill is a 76 year old right-handed  white male born 12-May-1935, with a history of organic brain  syndrome, seizures, schizophrenia.  The patient has been followed  through [facility] for a number of years, has had  difficulty to control seizure-type events that are often times nocturnal  in nature.  The patient has been on Dilantin, Depakote, and Lyrica.  The  patient has had some problems with urinary retention, was admitted to a  hospital in [place] and had apparent confusion that by report had been  occurring for a week prior.  The patient was transferred to [facility] with confusion on February 24, 2008.  The patient was found to have  an elevated ammonia level of 72 and the Depakote was held.  The ammonia  level was dropped at 44.  The patient's mental status is markedly  improved and neurology was asked to see this patient for further  evaluation.  The patient has not had any clear seizure-type events  recently.   PAST MEDICAL HISTORY:  1. Altered mental status on this admission.  2. Urinary retention.  3. Organic brain syndrome.  4. Schizophrenia.  5. Seizure disorder.  6. Cerebrovascular disease with left frontotemporal stroke that is      old.  7. Low back pain.  8. Compression fracture of the spine.  9. Gait disorder.  10.Dyslipidemia.  11.Osteoporosis.  12.Coronary artery disease status post CABG procedure.  13.History of hernia surgery.   ALLERGIES:  The patient has no known allergies.   Does not currently smoke or drink.   MEDICATIONS:  Include  1. Lyrica 100 mg twice daily.  2. Aspirin 81 mg daily.  3. The patient is also on lactulose 30 mL q.i.d.  4. Protonix 40 mg daily.  5. Dilantin 300 mg at night.  6. MiraLax 17 gm daily.  7. Zocor 20 mg daily.  8. Flomax 0.8 mg q.h.s.  9. Trazodone 50 mg at night.  10.Tylenol if needed.   SOCIAL HISTORY:  [place] in [place], is  married, lives with his wife, is not employed.   FAMILY MEDICAL HISTORY:  Is notable for mother died in mid 39s with  cancer, father died in early 81s with lung cancer.  The patient had a  brother that died suddenly.   REVIEW OF SYSTEMS:  Is notable for the patient denies fevers, chills,  denies headache, neck pain, denies shortness of breath, abdominal pain.  Does note some low back pain but denies pain radiating into the legs.  Does not report any numbness or weakness on the arms or legs.  Does feel  somewhat weak diffusely in  the lower extremities with some problems with  walking,  uses a walker to get around.  The patient denies any  dizziness.   PHYSICAL EXAMINATION:  VITALS:  Blood pressure is currently 90/64, heart  rate 84, respiratory 20, temperature afebrile.  GENERAL:  This patient is a thin white male who is alert and cooperative  at the time of examination.  The patient is oriented to person, place,  knows the month and year.  HEENT:  Head is atraumatic.  Pupils are round, reactive to light.  The  patient has divergent gaze.  NECK:  Supple.  No carotid bruits noted.  RESPIRATORY:  Clear.  CARDIOVASCULAR:  Regular rate and rhythm.  No obvious murmurs, rubs  noted.  EXTREMITIES:  Without significant edema.  NEUROLOGIC:  As above.  Facial symmetry is intact.  Again with  extraocular movement,  there appears to be exotropia of the left eyes as  compared to the right.  The patient has divergent gaze with superior  deviation of the eyes.  The patient has full visual fields.  Speech is  well enunciated, not  aphasic.  The patient has good strength in all  fours.  Good, symmetric motor tone is noted throughout.  Sensory testing  is intact to pinprick, soft touch and proprioception throughout.  The  patient has good finger-nose-finger and toe-to-finger bilaterally.  Gait  was not tested.  The patient has no evidence of asterixis or myoclonus.  Deep tendon reflexes are symmetric throughout.  Toes are neutral  bilaterally.   LABORATORY VALUES:  Notable for sodium 136, potassium 4.1, chloride 100,  CO2 27, BUN of 9, glucose of 85, creatinine of 0.61, calcium 9.0,  ammonia level 44, upper range normal for this lab is 35.  The patient  has negative RPR, TSH is 3.374, initial ammonia level was 72, white  count is 11.1, hemoglobin of 12.4, hematocrit 36.3, MCV of 96.7,  platelets of 633.   CT of the head shows an old left frontal and anterior temporal stroke  event.  There is possible right corona radiata lesion as well.   IMPRESSION:  1. Altered mental status with hyperammonemia.  2. Urinary retention.  3. Organic brain syndrome.  4. Schizophrenia.  5. Seizure disorder.  6. History of gait disorder.   This patient has had documented elevations in ammonia levels with  altered mental status.  The patient is clearly much better coming down  off the Depakote.  Depakote may have been a cause of the recent  encephalopathy.  Will need to discontinue this drug and  maintain the  patient on Dilantin and Lyrica and follow the patient over time.  If  needed, medication such as Keppra could be added.  Will check an MRI  scan of the brain at this point and check an EEG study which has already  been ordered.  The patient will require physical therapy for gait.  The  patient is very close to his usual baseline at this time.  Will follow  the patient's clinical course while in-house.      Marlan Palau, M.D.  Electronically Signed     CKW/MEDQ  D:  03/01/2008  T:  03/02/2008  Job:  [id number]   cc:    [facility]

## 2011-04-07 NOTE — H&P (Signed)
NAME:  Carl Hamilton, Carl Hamilton NO.:  [id number]   MEDICAL RECORD NO.:  [id number]          PATIENT TYPE:  INP   LOCATION:  5151                         FACILITY:  [facility]   PHYSICIAN:  Lonia Blood, M.D.       DATE OF BIRTH:  1935-01-18   DATE OF ADMISSION:  02/24/2008  DATE OF DISCHARGE:                              HISTORY & PHYSICAL   PRIMARY CARE PHYSICIAN:  In [place], [place], Dr. Foye Deer.   CHIEF COMPLAINT:  Altered mental status.   HISTORY OF PRESENT ILLNESS:  Carl Hamilton is a 76 year old gentleman  with history of schizophrenia, seizure disorder and dementia, who  presented to the emergency room of [facility] 48 hours prior to  admission, for significant abdominal pain.  He was found to have urinary  retention and had a Foley catheter placed.  He then was seen by a  urologist in [place] and was told to keep the Foley catheter in place  until April 14, 74, when he will do a procedure to shrink the  patient's prostate.  Carl Hamilton who is significantly demented did not  understand the importance of leaving a catheter in place, and he removed  it a couple times.  Last night he removed it, and promptly he started  having significant hematuria.  His wife describes the scene, as if  someone has just had a miscarriage in her house.  Carl Hamilton has been  brought today to the emergency room by his wife and had another catheter  inserted in his bladder.  Promptly thereafter, he became ill again  fidgety and required a dose of Atrovent to be sedated.  He currently is  sleeping peacefully on the bed.  The wife tells me that he has not been  himself for the past week, with decreased appetite and sleeping more and  being more confused.  She says that normally would be able to accomplish  small tasks like he working in the garden, and he that is not overly  agitated.  Carl Hamilton sees Dr. Lesia Sago from [facility] on a  regular basis, to have his antiepileptics adjusted.  The  last visit was in February 2009 when the patient was taken off of  Cogentin, and his Lyrica was decreased.   PAST MEDICAL HISTORY:  1. Coronary disease status post CABG.  2. Hyperlipidemia.  3. Hernia surgery.  4. Schizophrenia.  5. Dementia.  6. Seizure disorder.  7. Gait disorder.  8. Vertebral compression fractures.  9. Osteoporosis.  10.Lumbar spine stenosis.  11.Small lacunar strokes.   HOME MEDICATIONS:  1. Depakote 1000 mg twice a day and 500 mg in the middle of the day.  2. Calcium daily.  3. Dilantin 300 grams at bedtime.  4. Lyrica 100 mg twice a day.  5. Nexium 14 grams daily.  6. Lipitor 10 mg daily.   SOCIAL HISTORY:  Carl Hamilton is married and lives with his wife.  He is  retired, does not smoke cigarettes, does not drink alcohol.  He used to  work in a [employer],  but he had to quit work in 1982 due to dementia.   FAMILY HISTORY:  Positive for sudden death in a brother.  Mother died  age 81 with head and neck cancer.  Father died age at the age 65 with  lung cancer.   REVIEW OF SYSTEMS:  Unobtainable, as the patient is fast asleep.   PHYSICAL EXAM:  VITAL SIGNS:  Upon admission, temperature 96.9, blood  pressure 85/54, pulse 88, respirations 80, saturation 94%.  GENERAL APPEARANCE:  A well-developed male sleeping on a stretcher, does  not seem to be in any acute distress.  HEENT:  His head has on the left forehead an area of healed hematoma  with slight swelling.  Upon forcefully opening the eyes, they are both  midline with pupils about 3 mm symmetric and reactive to light.  Throat  is clear.  NECK:  The patient's neck supple without JVD.  No carotid bruits.  CHEST:  Clear to auscultation without wheezes, rhonchi or crackles.  HEART:  Regular rate and rhythm without murmurs, rubs or gallop.  ABDOMEN:  The patient plan is soft, nontender.  Bowel sounds decreased  but present.  LOWER  EXTREMITIES: Without edema.  NEUROLOGICAL:  Exam obviously unobtainable, since the patient is  sedated.   LABORATORY VALUES:  Time of admission:  Sodium 133, potassium 5.5,  chloride 104, BUN 21, creatinine 0.8, hemoglobin 12.2, white blood cell  count 10.3, hemoglobin 12.1, platelet count 487.  Dilantin level 4,  valproic acid level 56.  Urinalysis positive for leukocyte esterase,  white blood cells, red blood cells and bacteria.  Portable chest x-ray:  Some atelectasis.   ASSESSMENT/PLAN:  .  1. Urinary retention, most likely secondary to BPH and now also      secondary to Foley trauma, followed by hematuria.  I think it will      be absolutely impossible to explain to Carl Hamilton why he must      carry a Foley catheter with him at all times.  Our plan to address      the BPH medically and/or surgically in a semi-urgent fashion should      allow [place] to remove the Foley catheter soon.  This, I think will      improve the patient's agitation.  Carl Hamilton will be started on      Flomax, and urology consultation has been called.  2. Failure to thrive, dehydration.  This could be related to a urinary      tract infection but also could be related to progressive pain      related to his compression fractures.  Nevertheless, the patient      will be evaluated again once he is more awake, to assess the degree      of back pain that he is having from his compression fractures.  Mr.      Hamilton will receive intravenous fluids and a nutrition      consultation.  3. Seizure disorder.  For the time being, I do not have any evidence      to account for breakthrough seizures.  I will continue the      Depakote, Dilantin and Lyrica without major changes.  4. Coronary artery disease, currently asymptomatic.  Would hold the      aspirin while the patient is having hematuria and resume it when he      is safer.      Lonia Blood, M.D.  Electronically Signed  SL/MEDQ  D:  02/24/2008  T:   02/24/2008  Job:  [id number]

## 2011-04-07 NOTE — Consult Note (Signed)
NAMEMarland Kitchen  LAQUINCY, EASTRIDGE NO.:  [id number]   MEDICAL RECORD NO.:  [id number]          PATIENT TYPE:  INP   LOCATION:  5151                         FACILITY:  [facility]   PHYSICIAN:  Maretta Bees. Vonita Moss, M.D.DATE OF BIRTH:  1935/11/15   DATE OF CONSULTATION:  02/27/2008  DATE OF DISCHARGE:                                 CONSULTATION   I was asked to see this 76 year old gentleman for evaluation of urinary  retention.  He lives in [place], and a Foley catheter was inserted at  that facility, and he apparently was going to undergo a urologic  procedure.  The patient pulled out his Foley catheter a couple times and  required replacement, and he was transferred to this facility.  With his  dementia, it is hard to get an accurate voiding history, but I do not  think he has had a previous TUR of the prostate or prostatitis.  His  dementia is longstanding.   A PSA was done yesterday and was 7.63, but the elevation is probably in  all likelihood due to his prostate from his Foley trauma and removal of  the catheter with balloon filled.  He has normal renal function, with a  creatinine of 0.59, and a culture 2 days ago was negative.   MEDICAL PROBLEMS:  Besides his dementia, include:  1. Coronary disease, with a history of coronary artery bypass graft.  2. Hyperlipidemia.  3. Schizophrenia.  4. Vertebral compression fractures.  5. Osteoporosis.  6. Spinal stenosis.  7. He has also had previous strokes.   MEDICATIONS AT HOME:  Depakote, calcium, Dilantin, Lyrica, Nexium, and  Lipitor.   TOBACCO:  None.   ALCOHOL:  None   FAMILY HISTORY:  Noncontributory.   PHYSICAL EXAMINATION:  GENERAL:  Reveals a pleasant gentleman in no  acute distress.  VITAL SIGNS:  Normal vital signs.  LUNGS:  No respiratory distress.  ABDOMEN:  Soft, nontender.  No hernias or inguinal nodes.  GENITOURINARY:  Penis, urethral meatus, scrotum, testicles, epididymis  remarkable for Foley  catheter.  RECTAL:  Anal sphincter tone is normal.  Prostate feels just mildly  enlarged and smooth and benign.   IMPRESSION:  1. Urinary retention.  2. BNC, bladder neck contracture.  3. Elevated PSA, probably due to Foley trauma.   PLAN:  1. I suggest that he continue on Flomax.  2. I assume that he was just started on Proscar, and I think we will      stop that for now, in view of the fact that it takes 6 months to      work, and his prostate does not feel all that large.  3. Voiding trial tomorrow, and then in-and-out catheterization p.r.n.      to check residual urines.  Hopefully, he will respond to medical      treatment and not have to have a chronic Foley or prostatic      surgery.      Maretta Bees. Vonita Moss, M.D.  Electronically Signed     LJP/MEDQ  D:  02/27/2008  T:  02/28/2008  Job:  [id number]  cc:   Lonia Blood, M.D.

## 2011-04-07 NOTE — Procedures (Signed)
EEG NUMBER:  [phone]   HISTORY:  This is a 76 year old patient who has a history of seizures,  has presented with confusion.  The patient has history of dementia and  schizophrenia.  The patient being evaluated for the confusional episode.  This is a routine EEG.  No skull defects noted.  Medications include  Dilantin, Lyrica MiraLax, trazodone, Depakote, aspirin.   EEG classification normal awake and asleep.   DESCRIPTION OF RECORDING:  The background rhythm consists of a fairly  well modulated medium amplitude alpha rhythm of 8 Hz that is reactive to  eye open and closure.  As the record progresses, photic stimulation is  performed resulting in a bilateral and symmetric photic drive response.  Hyperventilation was not performed.  At no time during the recording  does there appear to be evidence of spike, spike wave discharges or  evidence of focal slowing.  Towards the end of the recording, the  patient enters early stage II sleep with rudimentary sleep spindles  seen.  Again, no spike, spike wave discharges seen any time.   EKG monitor shows no evidence of cardiac rhythm abnormalities with a  heart rate of 72.   IMPRESSION:  This is a normal EEG recording in the awake and sleeping  state.  No evidence of ictal or interictal discharges were seen.      Marlan Palau, M.D.  Electronically Signed     BJY:NWGN  D:  03/01/2008 14:28:23  T:  03/01/2008 15:02:39  Job #:  [id number]

## 2011-04-07 NOTE — Op Note (Signed)
NAME:  IVOR, KISHI NO.:  [id number]   MEDICAL RECORD NO.:  [id number]          PATIENT TYPE:  INP   LOCATION:  0003                         FACILITY:  [facility]   PHYSICIAN:  Maretta Bees. Vonita Moss, M.D.DATE OF BIRTH:  [birth year]   DATE OF PROCEDURE:  04/11/2008  DATE OF DISCHARGE:                               OPERATIVE REPORT   PREOPERATIVE DIAGNOSIS:  Bladder neck contracture, retention and  elevated PVR (postvoid residual).   POSTOPERATIVE DIAGNOSIS:  Bladder neck contracture, retention and  elevated PVR (postvoid residual).   PROCEDURE:  Cystoscopy and transurethral resection of prostate.   SURGEON:  Maretta Bees. Vonita Moss, M.D.   ANESTHESIA:  General.   INDICATIONS:  This pleasant 76 year old gentleman with the difficult  situation with some dementia has had increasing voiding problems and  urinary retention on two occasions and large residual urines.  He voids  on his own with the aid of 2 Flomax a day.  He was found to have  elevated residual urines again and up to 1000 mL and underwent cardiac  evaluation. He and his wife were counseled about the risks of infection,  retention, scarring, bleeding and incontinence.  It was felt that TUR of  the prostate would be beneficial for him in the long run.  We need to  try and get him voiding on his own since he does not tolerate catheters  well.   PROCEDURE IN DETAIL:  The patient was brought to the operating room and  placed in lithotomy position.  External genitalia were prepped and  draped in the usual fashion.  He was sounded from 37 to 58 [place] with  [employer].  Resectoscope sheath was inserted and the bladder had  trabeculation and a very high tight bladder neck but a small short  prostate.  His ureteral orifices were dropped downward as seen in a  cystocele in a lady or with a very large prostate in a man.  I resected  the high-riding bladder neck and floor of the prostate first and then  resected  just a small amount of lateral tissue that was present.  He was  resected down to capsule with essentially no significant bleeding.  There was good hemostasis by the use of electrocautery and at the end of  the case I irrigated out all prostate chips.  The ureteral orifices were  intact and the external sphincter was seen to be intact.  A #24 French  30 mL Foley was inserted with clear irrigation and connected to traction  with closed drainage.      Maretta Bees. Vonita Moss, M.D.  Electronically Signed     LJP/MEDQ  D:  04/11/2008  T:  04/11/2008  Job:  [id number]

## 2011-04-10 NOTE — Discharge Summary (Signed)
NAMEMarland Hamilton  MICAEL, BARB NO.:  [id number]   MEDICAL RECORD NO.:  [id number]          PATIENT TYPE:  INP   LOCATION:  1441                         [facility]ACILITY:  [facility]   PHYSICIAN:  Maretta Bees. Vonita Moss, M.D.DATE OF BIRTH:  1935-11-23   DATE OF ADMISSION:  04/11/2008  DATE OF DISCHARGE:  04/13/2008                               DISCHARGE SUMMARY   FINAL DIAGNOSES:  1. Bladder neck destruction.  2. Benign prostatic hypertrophy.  3. Coronary artery disease.  4. Schizophrenia.  5. Incomplete emptying of bladder.  6. Organic brain syndrome.   PROCEDURE:  Transurethral resection of prostate on Apr 11, 2008.   HISTORY:  This 76 year old gentleman has developed increased problems  with urination and he has gone into urinary retention and failed voiding  trial despite 2 Flomax a day.  His wife was counseled about TUR of the  prostate so he would not have to wear a catheter, which he does not  tolerate well.  He does have dementia, which complicates his voiding  situation.   PHYSICAL EXAMINATION:  Other than his altered mental status was  noncontributory.   HOSPITAL COURSE:  After admission he underwent TUR of the prostate.  His  postop course was quite benign with no fever and no significant  bleeding.  His Foley catheter was removed on the day of discharge and he  voided satisfactorily and was able to be discharged home in improved  condition.   He will resume diet as tolerated.  Return to the office in 2 weeks in  followup.  He will resume his usual medications including trazodone,  Lyrica, omeprazole, simvastatin, Dilantin, alendronate, calcium and  Flomax.      Maretta Bees. Vonita Moss, M.D.  Electronically Signed     LJP/MEDQ  D:  05/03/2008  T:  05/03/2008  Job:  [id number]

## 2011-08-05 ENCOUNTER — Emergency Department (HOSPITAL_COMMUNITY): Payer: Medicare Other

## 2011-08-05 ENCOUNTER — Inpatient Hospital Stay (HOSPITAL_COMMUNITY)
Admission: EM | Admit: 2011-08-05 | Discharge: 2011-08-14 | DRG: 193 | Disposition: A | Discharge status: Home Health | Payer: Medicare Other | Attending: Internal Medicine | Admitting: Internal Medicine

## 2011-08-05 DIAGNOSIS — E785 Hyperlipidemia, unspecified: Secondary | ICD-10-CM | POA: Diagnosis present

## 2011-08-05 DIAGNOSIS — G929 Unspecified toxic encephalopathy: Secondary | ICD-10-CM | POA: Diagnosis present

## 2011-08-05 DIAGNOSIS — I10 Essential (primary) hypertension: Secondary | ICD-10-CM | POA: Diagnosis present

## 2011-08-05 DIAGNOSIS — Z23 Encounter for immunization: Secondary | ICD-10-CM

## 2011-08-05 DIAGNOSIS — Z79899 Other long term (current) drug therapy: Secondary | ICD-10-CM

## 2011-08-05 DIAGNOSIS — Z8673 Personal history of transient ischemic attack (TIA), and cerebral infarction without residual deficits: Secondary | ICD-10-CM

## 2011-08-05 DIAGNOSIS — I2510 Atherosclerotic heart disease of native coronary artery without angina pectoris: Secondary | ICD-10-CM | POA: Diagnosis present

## 2011-08-05 DIAGNOSIS — J189 Pneumonia, unspecified organism: Principal | ICD-10-CM | POA: Diagnosis present

## 2011-08-05 DIAGNOSIS — Z9181 History of falling: Secondary | ICD-10-CM

## 2011-08-05 DIAGNOSIS — G40909 Epilepsy, unspecified, not intractable, without status epilepticus: Secondary | ICD-10-CM | POA: Diagnosis present

## 2011-08-05 DIAGNOSIS — G92 Toxic encephalopathy: Secondary | ICD-10-CM | POA: Diagnosis present

## 2011-08-05 DIAGNOSIS — F200 Paranoid schizophrenia: Secondary | ICD-10-CM | POA: Diagnosis present

## 2011-08-05 DIAGNOSIS — N400 Enlarged prostate without lower urinary tract symptoms: Secondary | ICD-10-CM | POA: Diagnosis present

## 2011-08-05 DIAGNOSIS — Z7982 Long term (current) use of aspirin: Secondary | ICD-10-CM

## 2011-08-05 DIAGNOSIS — R5381 Other malaise: Secondary | ICD-10-CM | POA: Diagnosis present

## 2011-08-05 DIAGNOSIS — F0280 Dementia in other diseases classified elsewhere without behavioral disturbance: Secondary | ICD-10-CM | POA: Diagnosis present

## 2011-08-05 DIAGNOSIS — Z951 Presence of aortocoronary bypass graft: Secondary | ICD-10-CM

## 2011-08-05 LAB — DIFFERENTIAL
Basophils Absolute: 0 10*3/uL (ref 0.0–0.1)
Eosinophils Absolute: 0.3 10*3/uL (ref 0.0–0.7)
Eosinophils Relative: 4 % (ref 0–5)
Lymphocytes Relative: 30 % (ref 12–46)
Neutrophils Relative %: 57 % (ref 43–77)

## 2011-08-05 LAB — BASIC METABOLIC PANEL
Chloride: 102 mEq/L (ref 96–112)
GFR calc Af Amer: >60 mL/min (ref >60)
Potassium: 4.4 mEq/L (ref 3.5–5.1)

## 2011-08-05 LAB — POCT I-STAT TROPONIN I: Troponin i, poc: 0 ng/mL (ref 0.00–0.08)

## 2011-08-05 LAB — PROTIME-INR
INR: 1.07 (ref 0.00–1.49)
Prothrombin Time: 14.1 seconds (ref 11.6–15.2)

## 2011-08-05 LAB — CBC
Platelets: 238 10*3/uL (ref 150–400)
RBC: 4.35 MIL/uL (ref 4.22–5.81)
RDW: 14.2 % (ref 11.5–15.5)
WBC: 7.3 10*3/uL (ref 4.0–10.5)

## 2011-08-06 LAB — URINALYSIS, ROUTINE W REFLEX MICROSCOPIC
Bilirubin Urine: NEGATIVE
Ketones, ur: NEGATIVE mg/dL
Nitrite: NEGATIVE
Protein, ur: NEGATIVE mg/dL
Urobilinogen, UA: 0.2 mg/dL (ref 0.0–1.0)

## 2011-08-06 LAB — RAPID URINE DRUG SCREEN, HOSP PERFORMED
Barbiturates: NOT DETECTED
Benzodiazepines: NOT DETECTED

## 2011-08-06 LAB — CK TOTAL AND CKMB (NOT AT ARMC)
CK, MB: 4.9 ng/mL — ABNORMAL HIGH (ref 0.3–4.0)
Relative Index: 3.8 — ABNORMAL HIGH (ref 0.0–2.5)
Total CK: 166 U/L (ref 7–232)
Total CK: 128 U/L (ref 7–232)

## 2011-08-06 LAB — CBC
HCT: 38.6 % — ABNORMAL LOW (ref 39.0–52.0)
MCV: 91.3 fL (ref 78.0–100.0)
RDW: 14.3 % (ref 11.5–15.5)
WBC: 10.4 10*3/uL (ref 4.0–10.5)

## 2011-08-06 LAB — COMPREHENSIVE METABOLIC PANEL
ALT: 17 U/L (ref 0–53)
AST: 25 U/L (ref 0–37)
Albumin: 3.5 g/dL (ref 3.5–5.2)
CO2: 19 mEq/L (ref 19–32)
Calcium: 9.5 mg/dL (ref 8.4–10.5)
Chloride: 103 mEq/L (ref 96–112)
GFR calc non Af Amer: >60 mL/min (ref >60)
Sodium: 139 mEq/L (ref 135–145)

## 2011-08-06 LAB — PRO B NATRIURETIC PEPTIDE: Pro B Natriuretic peptide (BNP): 190.1 pg/mL — ABNORMAL HIGH (ref 0–125)

## 2011-08-06 LAB — VITAMIN B12: Vitamin B-12: 647 pg/mL (ref 211–911)

## 2011-08-06 LAB — URINE CULTURE: Colony Count: NO GROWTH

## 2011-08-06 LAB — MAGNESIUM: Magnesium: 2 mg/dL (ref 1.5–2.5)

## 2011-08-06 LAB — PHOSPHORUS: Phosphorus: 3.6 mg/dL (ref 2.3–4.6)

## 2011-08-06 LAB — TROPONIN I: Troponin I: <0.3 ng/mL (ref <0.30)

## 2011-08-06 LAB — TSH: TSH: 1.803 u[IU]/mL (ref 0.350–4.500)

## 2011-08-06 LAB — CARDIAC PANEL(CRET KIN+CKTOT+MB+TROPI): Troponin I: <0.3 ng/mL (ref <0.30)

## 2011-08-07 LAB — CBC
HCT: 37.5 % — ABNORMAL LOW (ref 39.0–52.0)
Hemoglobin: 12.9 g/dL — ABNORMAL LOW (ref 13.0–17.0)
MCH: 31.6 pg (ref 26.0–34.0)
MCV: 91.9 fL (ref 78.0–100.0)
RBC: 4.08 MIL/uL — ABNORMAL LOW (ref 4.22–5.81)

## 2011-08-07 LAB — BASIC METABOLIC PANEL
BUN: 11 mg/dL (ref 6–23)
CO2: 28 mEq/L (ref 19–32)
Calcium: 9.2 mg/dL (ref 8.4–10.5)
Creatinine, Ser: 0.92 mg/dL (ref 0.50–1.35)
Glucose, Bld: 88 mg/dL (ref 70–99)

## 2011-08-07 LAB — CARDIAC PANEL(CRET KIN+CKTOT+MB+TROPI): Relative Index: 3.5 — ABNORMAL HIGH (ref 0.0–2.5)

## 2011-08-18 LAB — CBC
HCT: 35.4 — ABNORMAL LOW
HCT: 36.3 — ABNORMAL LOW
Hemoglobin: 12.4 — ABNORMAL LOW
MCV: 96.4
MCV: 97.5
Platelets: 503 — ABNORMAL HIGH
RBC: 3.42 — ABNORMAL LOW
RBC: 3.67 — ABNORMAL LOW
RBC: 3.75 — ABNORMAL LOW
RDW: 13.6
WBC: 10.3
WBC: 11.1 — ABNORMAL HIGH
WBC: 8.5

## 2011-08-18 LAB — POCT I-STAT, CHEM 8
BUN: 21
Calcium, Ion: 0.97 — ABNORMAL LOW
Chloride: 104
Glucose, Bld: 110 — ABNORMAL HIGH
HCT: 36 — ABNORMAL LOW
Potassium: 5.5 — ABNORMAL HIGH

## 2011-08-18 LAB — URINALYSIS, ROUTINE W REFLEX MICROSCOPIC
Bilirubin Urine: NEGATIVE
Ketones, ur: 15 — AB
Nitrite: NEGATIVE
Urobilinogen, UA: 1
pH: 6.5

## 2011-08-18 LAB — VALPROIC ACID LEVEL: Valproic Acid Lvl: 55.8

## 2011-08-18 LAB — TSH: TSH: 3.374

## 2011-08-18 LAB — BASIC METABOLIC PANEL
BUN: 11
BUN: 9
CO2: 27
Calcium: 8.3 — ABNORMAL LOW
Calcium: 9
Chloride: 100
Creatinine, Ser: 0.59
Creatinine, Ser: 0.61
GFR calc Af Amer: >60
GFR calc Af Amer: >60
GFR calc non Af Amer: >60
GFR calc non Af Amer: >60
Glucose, Bld: 85
Potassium: 4.1
Sodium: 136

## 2011-08-18 LAB — COMPREHENSIVE METABOLIC PANEL
ALT: 38
AST: 22
Albumin: 2.5 — ABNORMAL LOW
Alkaline Phosphatase: 99
BUN: 8
CO2: 27
Calcium: 8.8
Chloride: 98
Creatinine, Ser: 0.61
GFR calc Af Amer: >60
GFR calc non Af Amer: >60
Glucose, Bld: 96
Potassium: 4
Sodium: 134 — ABNORMAL LOW
Total Bilirubin: 0.2 — ABNORMAL LOW
Total Protein: 5.9 — ABNORMAL LOW

## 2011-08-18 LAB — PSA: PSA: 7.63 — ABNORMAL HIGH

## 2011-08-18 LAB — DIFFERENTIAL
Eosinophils Absolute: 0
Eosinophils Relative: 0
Lymphocytes Relative: 11 — ABNORMAL LOW
Lymphs Abs: 1.1
Monocytes Relative: 10

## 2011-08-18 LAB — TYPE AND SCREEN: ABO/RH(D): A POS

## 2011-08-18 LAB — POTASSIUM: Potassium: 4.1

## 2011-08-18 LAB — URINE MICROSCOPIC-ADD ON

## 2011-08-18 LAB — URINE CULTURE
Colony Count: NO GROWTH
Culture: NO GROWTH

## 2011-08-18 LAB — ABO/RH: ABO/RH(D): A POS

## 2011-08-18 LAB — AMMONIA
Ammonia: 26
Ammonia: 44 — ABNORMAL HIGH

## 2011-08-18 LAB — RPR: RPR Ser Ql: NONREACTIVE

## 2011-08-18 LAB — PHENYTOIN LEVEL, TOTAL: Phenytoin Lvl: 12.1

## 2011-08-19 LAB — BASIC METABOLIC PANEL
BUN: 7
CO2: 31
Calcium: 8.9
Calcium: 9.4
GFR calc Af Amer: >60
GFR calc non Af Amer: >60
Glucose, Bld: 93
Potassium: 4
Sodium: 140
Sodium: 141

## 2011-08-19 LAB — CARDIAC PANEL(CRET KIN+CKTOT+MB+TROPI)
CK, MB: 4.6 — ABNORMAL HIGH
CK, MB: 5.9 — ABNORMAL HIGH
Total CK: 113
Total CK: 146
Total CK: 168
Troponin I: 0.03

## 2011-08-19 LAB — CBC
HCT: 40.9
Hemoglobin: 13.7
MCHC: 33.4
MCHC: 33.7
Platelets: 214
Platelets: 240
RDW: 13.6
RDW: 13.6

## 2011-08-31 NOTE — Discharge Summary (Signed)
NAMEMarland Hamilton  Carl, Hamilton NO.:  [id number]  MEDICAL RECORD NO.:  [id number]  LOCATION:  2031                         FACILITY:  [facility]  PHYSICIAN:  Richarda Overlie, MD       DATE OF BIRTH:  January 08, 1935  DATE OF ADMISSION:  08/05/2011 DATE OF DISCHARGE:  08/07/2011                              DISCHARGE SUMMARY   PRIMARY CARE PHYSICIAN:  Loma Sender, MD  DISCHARGE DIAGNOSES: 1. Acute delirium secondary to community-acquired pneumonia in the     setting of underlying Parkinson disease. 2. Left lower lobe pneumonia. 3. Coronary artery disease. 4. Hypertension. 5. History of prior cerebrovascular accident. 6. Dementia. 7. History of recurrent falls. 8. Parkinson' disease. 9. Benign prostatic hypertrophy. 10.History of paranoid schizophrenia, currently stable. 11.Hyperlipidemia. 12.Generalized debilitation. 13.Dementia. 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 15.History of subdural hematoma. 16.History of vertebral compression fracture. 17.History of cataract surgery. 18.Cholecystectomy. 19.Strabismus. 20.Status post transurethral resection of the prostrate. 21.History of tardive dyskinesia secondary to antipsychotic. 22.Intermittent chronic headaches.  DISCHARGE MEDICATIONS: 1. Avelox 400 mg p.o. daily for 7 days. 2. Albuterol nebulizer inhaled q.4 p.r.n. 3. Vicodin 5/325 one to two tablets p.o. q.4 p.r.n. 4. Zofran 4 mg p.o. q.6 p.r.n. 5. Alendronate 70 mg 1 tablet every Sunday. 6. Aspirin 81 mg p.o. daily. 7. Calcium carbonate with vitamin D 1 tablet p.o. daily. 8. Klonopin 0.5 mg 1 tablet p.o. twice daily. 9. Lamictal 200 mg p.o. twice daily. 10.Namenda 10 mg p.o. twice daily. 11.Nitroglycerin sublingual 0.4 mg 1 tablet q.5 minutes x3. 12.Perphenazine 2 mg 2 tablets p.o. at bedtime. 13.Protonix 40 mg p.o. daily. 14.MiraLax 17 g p.o. daily as needed. 15.Pregabalin 100 mg p.o. daily, 1 tablet in the morning and 2 tablets     at bedtime. 16.Simvastatin 40 mg  p.o. daily. 17.Senokot tablet 2 tablets p.o. at bedtime. 18.Zetia 10 mg p.o. daily at bedtime.  PROCEDURES:  Chest x-ray shows mild left lower lobe pneumonia.  No acute displaced rib fracture.  Mild interval progression of multiple compression deformities along the thoracic and the upper lumbar spine. CT of the head without contrast shows no evidence of traumatic intracranial injury, moderately severe cortical volume loss, chronic encephalomalacia, chronic small lacuna infarcts in the basal ganglia.  SUBJECTIVE:  This is a 76 year old male who presents with altered mental status and history of falls.  The patient was brought in by the family secondary to the fall.  The patient also has a history of schizophrenia and was found to be somewhat aggressive at home with the right ear contusion.  However, he has not had any suicidal or homicidal ideation. The patient was also found to be short of breath and his chest x-ray revealed a left lower lobe pneumonia.  HOSPITAL COURSE: 1. Acute delirium, likely in the setting of his pneumonia with    baseline Parkinson dementia as well as paranoid schizophrenia.  I     feel the acute issue is probably his pneumonia.  The patient was     continued on his home psychotropic medications.  His trazodone was     discontinued. 2. Left lower lobe pneumonia.  The patient was started on Rocephin and     Zithromax and  was treated with this for 3 days.  He will be     switched to p.o. Avelox. 3. Coronary artery disease, currently stable.  Cardiac enzymes were     cycled and found to be negative. 4. Altered mental status, thought to be secondary to above.  TSH and     vitamin B12 were checked and found to be within normal limits. 5. Recurrent falls, likely secondary to deconditioning.  I do not feel     that the patient has any underlying symptoms of CVA. 6. BPH.  The patient required a Foley catheter during this admission.     We will discontinue this and  make sure that the patient can void on     his own prior to discharge.  PHYSICAL EXAMINATION:  VITAL SIGNS:  Blood pressure 103/69, pulse of 60, and 95% on room air GENERAL:  Currently, comfortable, in no acute cardiopulmonary distress. HEENT:  Pupils are equal and reactive.  Extraocular movements are intact. NECK:  Supple.  No JVD. LUNGS:  Clear to auscultation bilaterally.  No wheezes, crackles, or rhonchi. CARDIOVASCULAR:  Regular rate and rhythm.  No murmurs, rubs, or gallops. ABDOMEN:  Soft, nontender, and nondistended. EXTREMITIES:  Without cyanosis, clubbing, or edema. NEUROLOGIC:  Cranial nerves II-XII grossly intact.    Richarda Overlie, MD    NA/MEDQ  D:  08/07/2011  T:  08/07/2011  Job:  [id number]  Electronically Signed by Richarda Overlie MD on 08/31/2011 02:07:51 PM

## 2011-08-31 NOTE — Discharge Summary (Signed)
  NAMEMarland Hamilton  HATEM, CULL NO.:  [id number]  MEDICAL RECORD NO.:  [id number]  LOCATION:  2031                         FACILITY:  [facility]  PHYSICIAN:  Richarda Overlie, MD       DATE OF BIRTH:  1935/08/01  DATE OF ADMISSION:  08/05/2011 DATE OF DISCHARGE:  08/10/2011                              DISCHARGE SUMMARY   ADDENDUM  Kindly see discharge summary from August 07, 2011, for further details of the patient's discharge diagnosis, discharge medications, and current hospitalization.  PHYSICAL EXAMINATION:  VITAL SIGNS:  Blood pressure 132/79, pulse of 58, temperature 97.6, respirations 18, and 95% on room air. GENERAL:  Currently comfortable, in no acute cardiopulmonary distress. HEENT:  Pupils are equal and reactive to light.  Extraocular movements are intact. NECK:  Supple.  No JVD. LUNGS:  Clear to auscultation bilaterally.  No wheezes, crackles, or rhonchi. CARDIOVASCULAR:  Regular rate and rhythm.  No murmurs, rubs, or gallops. ABDOMEN:  Soft, nontender, and nondistended. NEUROLOGIC:  Cranial nerves II-XII grossly intact.  ASSESSMENT AND PLAN: 1. Left lower lobe pneumonia.  The patient is to continue with Avelox     for another 7 days.  He has completed a 5-day course of antibiotics     in the hospital. 2. Coronary artery disease, stable and the patient is free from any     cardiopulmonary symptoms at this point. 3. Altered mental status.  The patient is at his baseline.  He does     not have any acute psychiatric issues and he is not agitated at     this time.  DISPOSITION:  To be discharged to a skilled nursing facility when a bed is available.     Richarda Overlie, MD     NA/MEDQ  D:  08/10/2011  T:  08/10/2011  Job:  [id number]  Electronically Signed by Richarda Overlie MD on 08/31/2011 02:13:16 PM

## 2011-09-05 NOTE — H&P (Signed)
NAME:  Carl Hamilton, Carl Hamilton NO.:  [id number]  MEDICAL RECORD NO.:  [id number]  LOCATION:                                 FACILITY:  PHYSICIAN:  Lonia Blood, M.D.      DATE OF BIRTH:  1935/04/04  DATE OF ADMISSION:  08/06/2011 DATE OF DISCHARGE:                             HISTORY & PHYSICAL   PRIMARY CARE PHYSICIAN:  The patient is unassigned.  NEUROLOGIST:  Marlan Palau, MD  PRESENTING COMPLAINT:  Fall and altered mental status.  HISTORY OF PRESENT ILLNESS:  The patient is a 76 year old male brought in by his family secondary to a fall.  He has apparently been having some psychiatric problem and has had his medications recently changed. He has continued to be aggressive at home, had multiple falls with right ear contusion.  He was not acting like himself.  The patient is not able to tell them how many times he fell, but he has not lost consciousness. He has baseline history of dementia but per family, things have been getting worse.  They have also noticed slight shortness of breath in the patient.  They subsequently brought him to emergency room for further management.  PAST MEDICAL HISTORY:  Significant for: 1. CVA. 2. History of coronary artery disease. 3. Dementia. 4. History of hyperlipidemia. 5. Parkinson disease. 6. History of BPH. 7. Seizure disorder. 8. Generalized debilitation.  ALLERGIES:  No known drug allergies.  CURRENT MEDICATIONS: 1. Perphenazine 2 mg 2 tablets daily. 2. Nitroglycerin sublingual 0.4 mg q.5 minutes p.r.n. 3. Calcium carbonate with vitamin D, over the counter, 1 tablet daily. 4. Trazodone 50 mg nightly. 5. Senokot-S 2 tablets daily at bedtime. 6. Lyrica 100 mg b.i.d. 1-2 tablets. 7. MiraLax 17 g daily. 8. Protonix 40 mg daily. 9. Namenda 10 mg twice a day. 10.Simvastatin 40 mg nightly. 11.Zetia 10 mg daily. 12.Lamotrigine 200 mg twice a day. 13.Clonazepam 0.5 mg twice a day. 14.Aspirin 81 mg  daily. 15.Alendronate 70 mg every Sunday.  SOCIAL HISTORY:  The patient lives currently at home with family.  No tobacco, alcohol, or IV drug use.  He uses a walker to move around.  He has baseline dementia and needs a lot of help.  FAMILY HISTORY:  Not significant due to the patient's age.  PAST SURGICAL HISTORY: 1. Status post CABG. 2. Status post cataract surgery. 3. Status post cholecystectomy. 4. Status post hernia repair. 5. Status post TURP.  REVIEW OF SYSTEMS:  Currently unobtainable as the patient is confused.  PHYSICAL EXAMINATION:  VITAL SIGNS:  His temperature is 98.3, blood pressure 153/85, pulse 57, respiratory rate 18, sats 98% on room air. GENERAL:  He is awake but not alert or oriented.  He is confused, verbalizing, but in no acute distress. HEENT:  PERRL.  EOMI.  No significant pallor, no jaundice.  No rhinorrhea. NECK:  Supple.  No JVD, no lymphadenopathy. RESPIRATORY:  He has decreased air entry at the bases with some mild expiratory wheezing. CARDIOVASCULAR:  He has S1 and S2.  No murmur. ABDOMEN:  Soft, full, nontender with positive bowel sounds. EXTREMITIES:  She has no edema, cyanosis, or clubbing. SKIN:  He has some  contusion involving the right earlobe.  Some mild bruises on both elbows, otherwise, no other rashes or ulcers.  LABORATORY DATA:  White count 7.3, hemoglobin 13.8 with platelet count of 238.  Sodium 140, potassium 4.4, chloride 102, CO2 of 31, glucose 91, BUN 13, creatinine 0.95.  His calcium is 10.3.  PT 14.1, INR 1.07. Urinalysis so far negative.  Urine drug screen also negative.  Chest x-ray showed suspect mild left lower lobe pneumonia.  There is no acute displaced rib fracture.  There is mild interval progression of multiple compression deformities along with thoracic and upper lumbar spine. Head CT without contrast showed no evidence of traumatic intracranial injury or fracture.  There is moderately severe cortical volume  loss, chronic encephalomalacia within the inferior left frontal lobe and left temporal lobe, probably due to remote infarct.  There is small chronic lacuna infarct within the basal ganglia and pons. EKG showed no significant change.  ASSESSMENT:  This is a 76 year old gentleman presenting with altered mental status, probably acute metabolic encephalopathy.  The patient has history of some psychiatric illness including Parkinson's and dementia. This could be an acute delirium from his dementia, however, with a pneumonia, this could be the trigger.  PLAN: 1. Acute delirium.  We will address underlying cause.  I will also add     some Haldol p.r.n. for any agitation.  Once the patient is fully     cleared, we may have to get Psychiatry to further evaluate him in     case this is acute psychosis. 2. Left lower lobe pneumonia.  I will treat him for community-acquired     pneumonia since he is coming from home and his last hospitalization     was at least 6 months ago.  I will give him Rocephin, Zithromax. 3. Coronary artery disease.  Put him on tele and check his serial     cardiac enzymes.  At this point, he seems stable, no chest pain. 4. Hypertension.  Continue his home medications. 5. History of previous CVA.  He seems stable.  Continue with home     medicine. 6. Dementia.  Again, we will continue with his Namenda and also for     further evaluation by Psychiatry to differentiate his dementia from     possible psychosis. 7. Recurrent falls.  I will get PT, OT to evaluate him.  This could be     secondary to advanced dementia or some gait disturbance from his     Parkinson disease. 8. History of Parkinson disease.  Continue with home medicine also. 9. BPH.  He seems stable.  If needed, we will put a Foley in.     Lonia Blood, M.D.     Carl Hamilton  D:  08/06/2011  T:  08/06/2011  Job:  [id number]  Electronically Signed by Lonia Blood M.D. on 09/05/2011 03:18:19 PM

## 2011-09-06 NOTE — Discharge Summary (Signed)
NAMEMarland Kitchen  Carl Hamilton, TANT NO.:  [id number]  MEDICAL RECORD NO.:  [id number]  LOCATION:  2031                         FACILITY:  [facility]  PHYSICIAN:  Carl Scott, MD     DATE OF BIRTH:  [birth year]  DATE OF ADMISSION:  08/05/2011 DATE OF DISCHARGE:  08/12/2011                        DISCHARGE SUMMARY - REFERRING   ADDENDUM  PRIMARY CARE PHYSICIAN:  Carl Deer, MD, in [place].  NEUROLOGISTMarlan Palau, MD  This is an addendum to the discharge summaries done by Dr. Richarda Hamilton on August 10, 2011, which had a job number [id number] and discharge summary done on August 07, 2011, by the same MD with the job number of [id number].  Please send this discharge summary as well as the two summaries dictated by Dr. Susie Hamilton to Dr. Foye Hamilton and Dr. Lesia Hamilton.  This discharge summary will update events since August 10, 2011.  DISCHARGE DIAGNOSIS:  Unchanged.  Please refer to the previouslydictated discharge summaries.  DISCHARGE MEDICATIONS: 1. Alendronate 70 mg p.o. every Sunday. 2. Aspirin over the counter 81 mg p.o. daily. 3. Calcium carbonate/vitamin D over the counter 1 tablet p.o. daily. 4. Clonazepam 0.5 mg p.o. b.i.d. p.r.n. for agitation or anxiety. 5. Lamotrigine 200 mg p.o. b.i.d. 6. Namenda 10 mg p.o. b.i.d. 7. Nitroglycerin 0.4 mg sublingual p.r.n. chest pain. 8. Perphenazine 4 mg p.o. nightly. 9. Protonix 40 mg p.o. daily. 10.MiraLax 17 g p.o. daily p.r.n. constipation. 11.Lyrica 100 mg p.o. in the morning and 200 mg p.o. nightly. 12.Simvastatin 40 mg p.o. nightly. 13.Senna 2 tablets p.o. nightly. 14.Zetia 10 mg p.o. nightly.  Discontinued medications are trazodone.  IMAGING/PROCEDURES:  Since August 10, 2011, none.  LAB DATA:  Since August 10, 2011, none.  CONSULTATIONS:  None.  DIET:  Heart-healthy diet.  ACTIVITIES:  Out of bed with assist and increase activity slowly.  TODAY'S COMPLAINTS:  None.   According to the patient's nursing, the patient has not been agitated and has not required restraints or sitter. He has been pleasant and cooperative.  PHYSICAL EXAMINATION:  GENERAL:  The patient is in no obvious distress. VITAL SIGNS:  Temperature 98.8 degrees Fahrenheit, pulse 60 per minute, respirations 20 per minute, blood pressure is 121/84 mmHg, and saturating at 91% on room air. RESPIRATORY:  Clear.  No increased work of breathing. CARDIOVASCULAR:  First and second heart sounds heard, regular.  No JVD or murmurs. ABDOMEN:  Nondistended, nontender, soft, and normal bowel sounds heard. CENTRAL NERVOUS SYSTEM:  The patient is awake, alert, oriented to person and self, partly to time and place.  No focal neurological deficits.  HOSPITAL COURSE:  Carl Hamilton was admitted with history of altered mental status, falls.  He has previous history of schizophrenia, dementia, and Parkinson disease.  He was found to have community- acquired left lower lobe pneumonia.  His delirium was attributed to his acute illness complicating underlying dementia, paranoid schizophrenia, and Parkinson disease.  He is day #7 of IV Rocephin and Zithromax today. He denies any cough, dyspnea, or chest pain.  He is afebrile.  He will complete his antibiotic course today and do not see a reason for longer course of antibiotics at  this time.  Initially, the plan was to transfer the patient to a skilled nursing facility.  However, that has been declined by his insurance company.  His case has been discussed by the case manager with the spouse who is unable to afford out-of-pocket payment for skilled nursing facility and is eager to take him home with home health services.  DISPOSITION:  The patient is discharged home in stable condition.  FOLLOWUP RECOMMENDATIONS: 1. With Dr. Foye Hamilton.  The patient or family are to call for an     appointment to be seen in 5-7 days' time. 2. With Dr. Lesia Hamilton.  The  patient is to call for an appointment.  Time taken in coordinating this discharge is 30 minutes.     Carl Scott, MD     AH/MEDQ  D:  08/12/2011  T:  08/12/2011  Job:  [id number]  cc:   Carl Deer, MD C. Carl Hamilton, M.D.  Electronically Signed by Carl Scott MD on 09/06/2011 11:50:15 PM

## 2012-02-19 IMAGING — CR DG CHEST 1V PORT
1 series · 1 of 1 positions shown · non-contrast
Comparison: 08/29/2010.

CLINICAL DATA: Altered mental status

PORTABLE CHEST - 1 VIEW

[view not recorded]
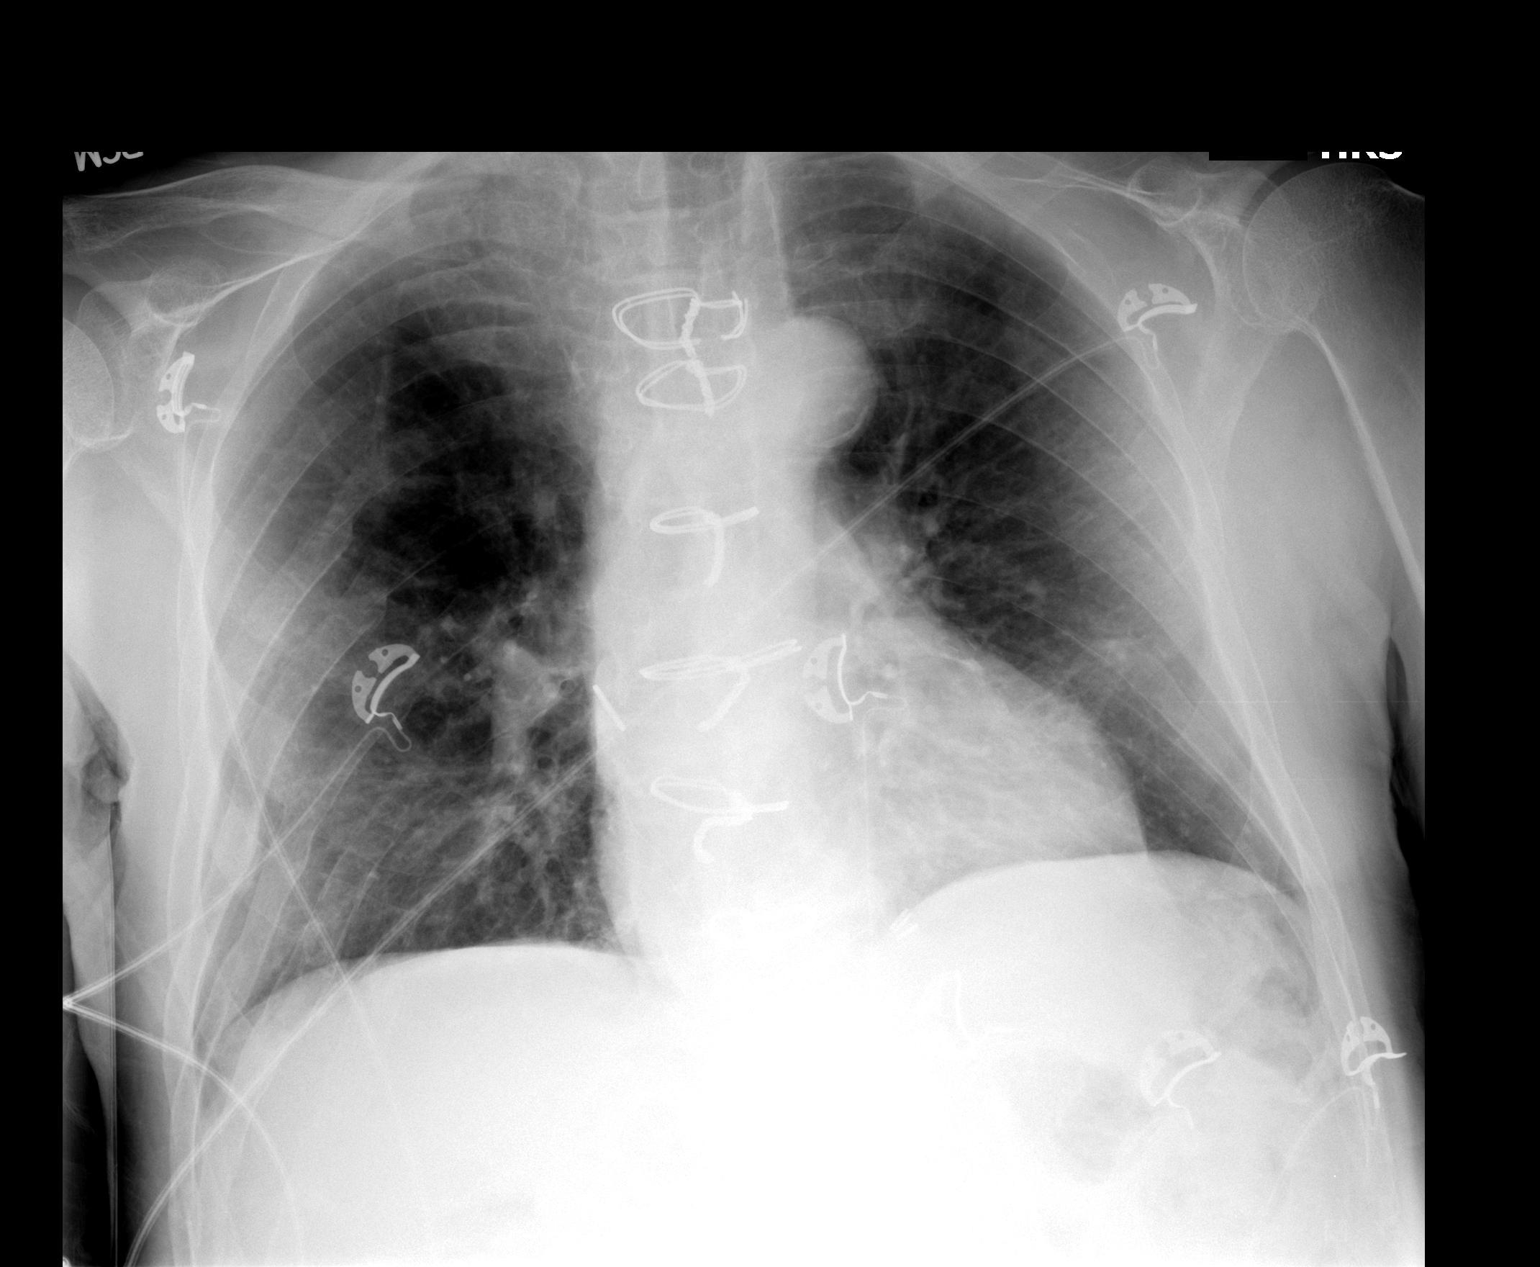

[1 of 1 positions shown; findings below may reference images not displayed]

FINDINGS: Post mediastinotomy.  Calcified tortuous aorta.  Mild
central pulmonary vascular prominence.

New opacity left heart border may represent atelectasis and can be
evaluated follow-up.
IMPRESSION: Calcified tortuous aorta.  Mild central pulmonary vascular
prominence.

New opacity left heart border may represent atelectasis and can be
evaluated follow-up.

## 2012-05-17 ENCOUNTER — Emergency Department (HOSPITAL_COMMUNITY): Payer: Medicare Other

## 2012-05-17 ENCOUNTER — Encounter (HOSPITAL_COMMUNITY): Payer: Self-pay | Admitting: *Deleted

## 2012-05-17 ENCOUNTER — Emergency Department (HOSPITAL_COMMUNITY)
Admission: EM | Admit: 2012-05-17 | Discharge: 2012-05-18 | Disposition: A | Discharge status: Home / Self Care | Payer: Medicare Other | Attending: Emergency Medicine | Admitting: Emergency Medicine

## 2012-05-17 DIAGNOSIS — F0280 Dementia in other diseases classified elsewhere without behavioral disturbance: Secondary | ICD-10-CM | POA: Insufficient documentation

## 2012-05-17 DIAGNOSIS — G309 Alzheimer's disease, unspecified: Secondary | ICD-10-CM | POA: Insufficient documentation

## 2012-05-17 DIAGNOSIS — G20 Parkinson's disease: Secondary | ICD-10-CM | POA: Insufficient documentation

## 2012-05-17 DIAGNOSIS — I10 Essential (primary) hypertension: Secondary | ICD-10-CM | POA: Insufficient documentation

## 2012-05-17 DIAGNOSIS — M25569 Pain in unspecified knee: Secondary | ICD-10-CM | POA: Insufficient documentation

## 2012-05-17 DIAGNOSIS — R4182 Altered mental status, unspecified: Secondary | ICD-10-CM | POA: Insufficient documentation

## 2012-05-17 DIAGNOSIS — F02818 Dementia in other diseases classified elsewhere, unspecified severity, with other behavioral disturbance: Secondary | ICD-10-CM | POA: Insufficient documentation

## 2012-05-17 DIAGNOSIS — Z7982 Long term (current) use of aspirin: Secondary | ICD-10-CM | POA: Insufficient documentation

## 2012-05-17 DIAGNOSIS — G20A1 Parkinson's disease without dyskinesia, without mention of fluctuations: Secondary | ICD-10-CM | POA: Insufficient documentation

## 2012-05-17 DIAGNOSIS — Z79899 Other long term (current) drug therapy: Secondary | ICD-10-CM | POA: Insufficient documentation

## 2012-05-17 DIAGNOSIS — F0281 Dementia in other diseases classified elsewhere with behavioral disturbance: Secondary | ICD-10-CM

## 2012-05-17 HISTORY — DX: Essential (primary) hypertension: I10

## 2012-05-17 HISTORY — DX: Unspecified osteoarthritis, unspecified site: M19.90

## 2012-05-17 HISTORY — DX: Parkinson's disease: G20

## 2012-05-17 HISTORY — DX: Cerebral infarction, unspecified: I63.9

## 2012-05-17 HISTORY — DX: Epilepsy, unspecified, not intractable, without status epilepticus: G40.909

## 2012-05-17 HISTORY — DX: Alzheimer's disease, unspecified: G30.9

## 2012-05-17 HISTORY — DX: Dementia in other diseases classified elsewhere, unspecified severity, without behavioral disturbance, psychotic disturbance, mood disturbance, and anxiety: F02.80

## 2012-05-17 HISTORY — DX: Parkinson's disease without dyskinesia, without mention of fluctuations: G20.A1

## 2012-05-17 LAB — BASIC METABOLIC PANEL
Chloride: 103 mEq/L (ref 96–112)
GFR calc non Af Amer: 70 mL/min — ABNORMAL LOW (ref >90)
Glucose, Bld: 89 mg/dL (ref 70–99)
Potassium: 4 mEq/L (ref 3.5–5.1)
Sodium: 141 mEq/L (ref 135–145)

## 2012-05-17 LAB — CBC
MCH: 31.1 pg (ref 26.0–34.0)
Platelets: 235 10*3/uL (ref 150–400)
RBC: 4.15 MIL/uL — ABNORMAL LOW (ref 4.22–5.81)
WBC: 7.1 10*3/uL (ref 4.0–10.5)

## 2012-05-17 LAB — DIFFERENTIAL
Basophils Absolute: 0 K/uL (ref 0.0–0.1)
Basophils Relative: 0 % (ref 0–1)
Eosinophils Absolute: 0.1 K/uL (ref 0.0–0.7)
Eosinophils Relative: 2 % (ref 0–5)
Lymphocytes Relative: 35 % (ref 12–46)
Lymphs Abs: 2.5 K/uL (ref 0.7–4.0)
Monocytes Absolute: 0.6 K/uL (ref 0.1–1.0)
Monocytes Relative: 9 % (ref 3–12)
Neutro Abs: 3.9 K/uL (ref 1.7–7.7)
Neutrophils Relative %: 54 % (ref 43–77)

## 2012-05-17 NOTE — ED Notes (Signed)
Cleaned pt for CCUS.  Pt has urinal at this time.

## 2012-05-17 NOTE — ED Notes (Signed)
Patent is resident  at [facility] in the alzheimer's unit, today patient becoming violent and emotional  Which per family is out of norm for patient. Patient family states this has happened in the past with medication regime that was not correct and UTI.  Patient with family accompanying him.

## 2012-05-17 NOTE — ED Notes (Signed)
Delay on urine due to patient being in radiology.

## 2012-05-17 NOTE — ED Provider Notes (Signed)
History     CSN: [id number]  Arrival date & time 05/17/12  1704   First MD Initiated Contact with Patient 05/17/12 2051      Chief Complaint  Patient presents with  . Altered Mental Status    (Consider location/radiation/quality/duration/timing/severity/associated sxs/prior treatment) HPI Comments: Patient presents today with family for some altered mental status.  All of the history does come from the family since the patient himself has history of Alzheimer's dementia.  Patient currently resides at [facility] in an Alzheimer's unit.  Patient does have some noted history of at times violent behavior.  Apparently today he was more aggressive and emotional than normal.  He also did not recognize family members which she normally would.  Despite family concerned and they brought him in for further evaluation to make sure he did not have a urinary tract infection or other acute metabolic problem.  They do not note any recent medication changes.  There's been no other known injuries.  On our evaluation in the emergency department patient is now calm, cooperative and recognizing family members appropriately.  Patient is a 77 y.o. male presenting with altered mental status. The history is provided by a relative. The history is limited by the condition of the patient (dementia). No language interpreter was used.  Altered Mental Status This is a new problem. Pertinent negatives include no chest pain, no abdominal pain, no headaches and no shortness of breath.    Past Medical History  Diagnosis Date  . Alzheimer disease   . Hypertension   . Osteoarthritis   . Epilepsia   . Parkinson disease   . Stroke     Past Surgical History  Procedure Date  . Cardiac surgery     No family history on file.  History  Substance Use Topics  . Smoking status: Not on file  . Smokeless tobacco: Not on file  . Alcohol Use: No      Review of Systems  Constitutional: Negative.  Negative for fever  and chills.  HENT: Negative.   Eyes: Negative.  Negative for discharge.  Respiratory: Negative.  Negative for cough and shortness of breath.   Cardiovascular: Negative.  Negative for chest pain.  Gastrointestinal: Negative.  Negative for nausea, vomiting and abdominal pain.  Genitourinary: Negative.  Negative for hematuria.  Musculoskeletal: Negative for back pain.  Skin: Negative.  Negative for color change and rash.  Neurological: Negative for syncope and headaches.  Hematological: Negative.  Negative for adenopathy.  Psychiatric/Behavioral: Positive for confusion, agitation and altered mental status.  All other systems reviewed and are negative.    Allergies  Dilantin  Home Medications   Current Outpatient Rx  Name Route Sig Dispense Refill  . ALENDRONATE SODIUM 70 MG PO TABS Oral Take 70 mg by mouth every 7 (seven) days. On Monday. Take with a full glass of water on an empty stomach.    . ASPIRIN EC 81 MG PO TBEC Oral Take 81 mg by mouth daily.    Marland Kitchen CALCIUM CARBONATE-VITAMIN D 500-200 MG-UNIT PO TABS Oral Take 1 tablet by mouth 2 (two) times daily.    . CYANOCOBALAMIN 1000 MCG/ML IJ SOLN Intramuscular Inject 1,000 mcg into the muscle every 30 (thirty) days.    Marland Kitchen EZETIMIBE 10 MG PO TABS Oral Take 10 mg by mouth daily.    Marland Kitchen PANTOPRAZOLE SODIUM 40 MG PO TBEC Oral Take 40 mg by mouth daily.    Marland Kitchen PREGABALIN 100 MG PO CAPS Oral Take 100 mg by  mouth daily.      BP 129/84  Pulse 63  Temp 97.5 F (36.4 C) (Oral)  Resp 16  SpO2 95%  Physical Exam  Nursing note and vitals reviewed. Constitutional: He is oriented to person, place, and time. He appears well-developed and well-nourished.  Non-toxic appearance. He does not have a sickly appearance.  HENT:  Head: Normocephalic and atraumatic.  Eyes: Conjunctivae, EOM and lids are normal. Pupils are equal, round, and reactive to light.  Neck: Trachea normal, normal range of motion and full passive range of motion without pain. Neck  supple.  Cardiovascular: Normal rate, regular rhythm and normal heart sounds.   Pulmonary/Chest: Effort normal and breath sounds normal. No respiratory distress. He has no wheezes. He has no rales.  Abdominal: Soft. Normal appearance. He exhibits no distension. There is no tenderness. There is no rebound and no CVA tenderness.  Musculoskeletal: Normal range of motion.       Left knee does not demonstrate any swelling, erythema or warmth.  Patient will be to flex and extend it.  He does note some tenderness to palpation over the distal medial aspect of his knee  Neurological: He is alert and oriented to person, place, and time. He has normal strength.  Skin: Skin is warm, dry and intact. No rash noted.  Psychiatric:       Patient is calm and cooperative in the emergency department but not fully oriented    ED Course  Procedures (including critical care time)  Results for orders placed during the hospital encounter of 05/17/12  CBC      Component Value Range   WBC 7.1  4.0 - 10.5 K/uL   RBC 4.15 (*) 4.22 - 5.81 MIL/uL   Hemoglobin 12.9 (*) 13.0 - 17.0 g/dL   HCT 62.1 (*) 30.8 - 65.7 %   MCV 92.8  78.0 - 100.0 fL   MCH 31.1  26.0 - 34.0 pg   MCHC 33.5  30.0 - 36.0 g/dL   RDW 84.6  96.2 - 95.2 %   Platelets 235  150 - 400 K/uL  DIFFERENTIAL      Component Value Range   Neutrophils Relative 54  43 - 77 %   Neutro Abs 3.9  1.7 - 7.7 K/uL   Lymphocytes Relative 35  12 - 46 %   Lymphs Abs 2.5  0.7 - 4.0 K/uL   Monocytes Relative 9  3 - 12 %   Monocytes Absolute 0.6  0.1 - 1.0 K/uL   Eosinophils Relative 2  0 - 5 %   Eosinophils Absolute 0.1  0.0 - 0.7 K/uL   Basophils Relative 0  0 - 1 %   Basophils Absolute 0.0  0.0 - 0.1 K/uL  BASIC METABOLIC PANEL      Component Value Range   Sodium 141  135 - 145 mEq/L   Potassium 4.0  3.5 - 5.1 mEq/L   Chloride 103  96 - 112 mEq/L   CO2 27  19 - 32 mEq/L   Glucose, Bld 89  70 - 99 mg/dL   BUN 12  6 - 23 mg/dL   Creatinine, Ser 8.41  0.50 -  1.35 mg/dL   Calcium 9.4  8.4 - 32.4 mg/dL   GFR calc non Af Amer 70 (*) >90 mL/min   GFR calc Af Amer 82 (*) >90 mL/min   Dg Chest 2 View  05/17/2012  *RADIOLOGY REPORT*  Clinical Data: Altered mental status.  CHEST - 2 VIEW  Comparison: 08/21/2011  Findings: Stable appearance of postoperative changes in the mediastinum.  Borderline heart size with normal pulmonary vascularity.  Calcified and tortuous aorta.  No focal airspace consolidation in the lungs.  No blunting of costophrenic angles. Multiple old right rib fractures and old left clavicular fracture. Stable appearance since previous study.  IMPRESSION: No evidence of active pulmonary disease.  Original Report Authenticated By: Marlon Pel, M.D.   Dg Knee 2 Views Left  05/17/2012  *RADIOLOGY REPORT*  Clinical Data: Medial knee pain.  No known injury.  LEFT KNEE - 1-2 VIEW  Comparison: None.  Findings: Degenerative changes in the left knee with medial and lateral compartment narrowing and mild hypertrophic changes. Cartilaginous calcification in the medial and lateral compartment consistent with chondrocalcinosis.  No evidence of acute fracture or subluxation.  No focal bone lesion or bone destruction.  No significant effusion.  Vascular calcifications.  IMPRESSION: Degenerative changes in the left knee with chondrocalcinosis.  No acute bony abnormalities.  Original Report Authenticated By: Marlon Pel, M.D.      Date: 05/17/2012  Rate: 63  Rhythm: normal sinus rhythm  QRS Axis: normal  Intervals: normal  ST/T Wave abnormalities: normal  Conduction Disutrbances:none  Narrative Interpretation:   Old EKG Reviewed: unchanged from 08-08-11    MDM  Patient with some alteration in his baseline mental status but he's been calm here in the emergency department.  We have looked for possible causes for altered mental status such as UTI infection, pneumonia, electrolyte abnormalities or glucose disturbances.  Patient's initial CBC,  BMP, EKG and chest x-ray showed no acute abnormalities.  A knee x-ray was obtained since the patient did start to complain of some left knee pain and there is a possible unknown injury.  If no urinary tract infection is found I feel the patient is safe for discharge back to his Alzheimer's facility.        Nat Christen, MD 05/18/12 [phone]

## 2012-05-18 LAB — URINALYSIS, ROUTINE W REFLEX MICROSCOPIC
Hgb urine dipstick: NEGATIVE
Nitrite: NEGATIVE
Specific Gravity, Urine: 1.008 (ref 1.005–1.030)
Urobilinogen, UA: 0.2 mg/dL (ref 0.0–1.0)

## 2012-05-18 MED ORDER — LORAZEPAM 1 MG PO TABS
ORAL_TABLET | ORAL | Status: AC
  Filled 2012-05-18: qty 1

## 2012-05-18 MED ORDER — LORAZEPAM 1 MG PO TABS: 0.5000 mg | ORAL_TABLET | Freq: Once | ORAL | Status: DC

## 2012-05-18 MED ORDER — LORAZEPAM 1 MG PO TABS
0.5000 mg | ORAL_TABLET | Freq: Once | ORAL | Status: AC
  Administered 2012-05-18: 0.5 mg via ORAL
  Filled 2012-05-18: qty 1

## 2012-05-18 NOTE — Discharge Instructions (Signed)
Alzheimer's Disease, Caregiver Guide Caring for a person with Alzheimer's disease (AD) at home is a difficult task and can become overwhelming at times. Each day brings new challenges as the caregiver copes with changing levels of ability and new patterns of behavior. Research has shown that caregivers themselves often are at increased risk for depression and illness, especially if they do not receive adequate support from family, friends, and the community.  One of the biggest struggles caregivers face is dealing with the difficult behaviors of the person they are caring for. Dressing, bathing, eating, and basic activities of daily living often become difficult to manage for both the person with AD and the caregiver. Having a plan for getting through the day can help caregivers cope. Many caregivers have found it helpful to use strategies for dealing with difficult behaviors and stressful situations. Following are some suggestions to consider when faced with difficult aspects of caring for a person with AD. DEALING WITH THE DIAGNOSIS Finding out that a loved one has Alzheimer's disease can be stressful, frightening, and overwhelming. As you begin to take stock of the situation, here are some tips that may help:   Ask the doctor any questions you have about AD. Find out what treatments might work best to lessen symptoms or to address behavior problems.   Contact organizations such as the Alzheimer's Association and the Alzheimer's Disease Education and Referral (ADEAR) Center for more information about the disease, treatment options, and caregiving resources. Some community groups may offer classes to teach caregiving, problem solving, and management skills.   Find a support group where you can share your feelings and concerns. Members of support groups often have helpful ideas or know of useful resources based on their own experiences. Online support groups make it possible for caregivers to receive  support without having to leave home.   Study your day to see if you can develop a routine that makes things go more smoothly. If there are times of day when the person with AD is less confused or more cooperative, plan your routine to make the most of those moments. Keep in mind that the way the person functions may change from day to day, so try to be flexible and adapt your routine as needed.   Consider using adult day care or respite services to ease the day-to-day demands of caregiving. These services allow you to have a break while knowing that the person with AD is being well cared for.   Begin to plan for the future. This may include getting financial and legal documents in order, investigating long-term care options, and determining what services are covered by health insurance and Medicare.  COMMUNICATION Trying to communicate with a person who has AD can be a challenge. Both understanding and being understood may be difficult.   Choose simple words and short sentences and use a gentle, calm tone of voice.   Avoid talking to the person with AD like a baby or talking about the person as if he or she was not there.   Minimize distractions and noise, such as the television or radio, to help the person focus on what you are saying.   Call the person by name, making sure you have his or her attention before speaking.   Allow enough time for a response. Be careful not to interrupt.   If the person with AD is struggling to find a word or communicate a thought, gently try to provide the word he or she  is looking for.   Try to frame questions and instructions in a positive way.  BATHING While some people with AD do not mind bathing, for others it is a frightening, confusing experience. Advance planning can help make bath time better for both of you.   Plan the bath or shower for the time of day when the person is most calm and agreeable. Be consistent. Try to develop a routine.    Respect the fact that bathing is scary and uncomfortable for some people with AD. Be gentle and respectful. Be patient and calm.   Tell the person what you are going to do, step-by-step, and allow him or her to do as much as possible.   Prepare in advance. Make sure you have everything you need ready and in the bathroom before beginning. Draw the bath ahead of time.   Be sensitive to the temperature. Warm up the room beforehand, if necessary, and keep extra towels and a robe nearby. Test the water temperature before beginning the bath or shower.   Minimize safety risks by using a handheld showerhead, shower bench, grab bars, and nonskid bath mats. Never leave the person alone in the bath or shower.   Try a sponge bath. Bathing may not be necessary every day. A sponge bath can be effective between showers or baths.  DRESSING For someone who has AD, getting dressed presents a series of challenges: choosing what to wear, getting some clothes off and other clothes on, and struggling with buttons and zippers. Minimizing the challenges may make a difference.   Try to have the person get dressed at the same time each day so he or she will come to expect it as part of the daily routine.   Encourage the person to dress himself or herself to whatever degree possible. Plan to allow extra time so there is no pressure or rush.   Allow the person to choose from a limited selection of outfits. If he or she has a favorite outfit, consider buying several identical sets.   Arrange the clothes in the order they are to be put on, to help the person move through the process.   Provide clear, step-by-step instructions if the person needs prompting.   Choose clothing that is comfortable, easy to get on and off, and easy to care for. Elastic waists and Velcro enclosures minimize struggles with buttons and zippers.  EATING  Eating can be a challenge. Some people with AD want to eat all the time, while others  need to be encouraged to maintain a good diet.    Ensure a quiet, calm atmosphere for eating. Limiting noise and other distractions may help the person focus on the meal.   Provide a limited number of choices of food and serve small portions. You may want to offer several small meals throughout the day in place of three larger ones.   Use straws or cups with lids to make drinking easier.   Substitute finger foods if the person struggles with utensils. Using a bowl instead of a plate may also help.   Have healthy snacks on hand. To encourage eating, keep the snacks where they can be seen.   Visit the dentist regularly to keep mouth and teeth healthy.  ACTIVITIES What to do all day? Finding activities that the person with AD can do can be a challenge. Building on current skills generally works better than trying to teach something new.   Do not expect too much. Simple  activities often are best, especially when they use current abilities.   Help the person get started on an activity. Break the activity down into small steps and praise the person for each step he or she completes.   Watch for signs of agitation or frustration with an activity. Gently help or distract the person to something else.   Incorporate activities the person seems to enjoy into your daily routine and try to do them at a similar time each day.   Take advantage of adult daycare services, which provide various activities for the person with AD, as well as an opportunity for caregivers to gain temporary relief from tasks associated with caregiving. Transportation and meals often are provided.  EXERCISE Incorporating exercise into the daily routine has benefits for both the person with AD and the caregiver. Not only can it improve health, but it can also provide a meaningful activity for both of you to share.   Think about what kind of physical activities you both enjoy, perhaps walking, swimming, tennis, dancing, or  gardening. Determine the time of day and place where this type of activity would work best.   Be realistic in your expectations. Build slowly, perhaps just starting with a short walk around the yard, for example, before progressing to a walk around the block.   Be aware of any discomfort or signs of overexertion. Talk to the person's doctor if this happens.   Allow as much independence as possible, even if it means a less-than-perfect garden or a scoreless tennis match.   See what kinds of exercise programs are available in your area. Senior centers may have group programs for people who enjoy exercising with others. Local malls often have walking clubs and provide a place to exercise when the weather is bad.   Encourage physical activities. Spend time outside when the weather permits. Exercise often helps everyone sleep better.  INCONTINENCE As the disease progresses, many people with AD begin to experience incontinence, or the inability to control their bladder and/or bowels. Incontinence can be upsetting to the person and difficult for the caregiver. Sometimes incontinence is due to physical illness, so be sure to discuss it with the person's doctor.   Have a routine for taking the person to the bathroom and stick to it as closely as possible. For example, take the person to the bathroom every 3 hours or so during the day. Do not wait for the person to ask.   Watch for signs that the person may need to go to the bathroom, such as restlessness or pulling at clothes. Respond quickly.   Be understanding when accidents occur. Stay calm and reassure the person if he or she is upset. Try to keep track of when accidents happen to help plan ways to avoid them.   To help prevent nighttime accidents, limit certain types of fluids, such as those with caffeine, in the evening.   If you are going to be out with the person, plan ahead. Know where restrooms are located, and have the person wear simple,  easy-to-remove clothing. Take an extra set of clothing along, in case of an accident.  SLEEP PROBLEMS For the exhausted caregiver, sleep cannot come too soon. For many people with AD, however, nighttime may be a difficult time. Getting the person to go to bed and stay there may require some advance planning.   Set a quiet, peaceful tone in the evening to encourage sleep. Keep the lights dim, eliminate loud noises, even play  soothing music if the person seems to enjoy it.   Try to keep bedtime at a similar time each evening. Developing a bedtime routine may help.   Encourage exercise during the day and limit daytime napping.   Restrict access to caffeine late in the day.   Use night lights in the bedroom, hall, and bathroom if the darkness is frightening or disorienting.  HALLUCINATIONS AND DELUSIONS  As the disease progresses, a person with AD may experience hallucinations and/or delusions. Hallucinations are when the person sees, hears, smells, tastes, or feels something that is not there. Delusions are false beliefs from which the person cannot be dissuaded.   Sometimes hallucinations and delusions are a sign of a physical illness. Keep track of what the person is experiencing and discuss it with the doctor.   Avoid arguing with the person about what he or she sees or hears. Try to respond to the feelings he or she is expressing, and provide reassurance and comfort.   Try to distract the person to another topic or activity. Sometimes moving to another room or going outside for a walk may help.   Turn off the television when violent or disturbing programs are on. The person with AD may not be able to distinguish television programming from reality.   Make sure the person is safe and does not have access to anything he or she could use to harm anyone.  WANDERING Keeping the person safe is one of the most important aspects of caregiving. Some people with AD have a tendency to wander away  from their home or their caregiver. Knowing what to do to limit wandering can protect a person from becoming lost.   Make sure that the person carries some kind of identification or wears a medical bracelet. If he or she gets lost and is unable to communicate adequately, this will alert others to his or her identity and medical condition.   Keep a recent photograph or videotape of the person with AD, to assist police if the person becomes lost.   Keep doors locked. Consider a keyed deadbolt or an additional lock, up high or down low, on the door. If the person can open a lock because it is familiar, a new latch or lock may help.   Be sure to secure or put away anything that could cause danger, both inside and outside the house.  HOME SAFETY Caregivers of people with AD often need to look at their homes through new eyes to identify and correct safety risks. Creating a safe environment can prevent many stressful and dangerous situations.   Install secure locks on all outside windows and doors, especially if the person is prone to wandering.   Remove the locks on bathroom doors, to prevent the person from accidentally locking himself or herself in.   Use childproof latches on kitchen cabinets and any place where cleaning supplies or other chemicals are kept.   Label medications, and keep them locked up. Also, make sure knives, lighters, matches, and guns are secured and out of reach.   Keep the house free from clutter. Remove scatter rugs and anything else that might contribute to a fall.   Make sure lighting is good, both inside and outside.   Consider installing an automatic shut-off switch on the stove to prevent burns or fire.  DRIVING Making the decision that a person with AD is no longer safe to drive is difficult, and it needs to be communicated carefully and sensitively.  Even though the person may be upset by the loss of independence, safety must be the priority.   Look for clues  that safe driving is no longer possible, including getting lost in familiar places, driving too fast or too slow, disregarding traffic signs, or getting angry or confused.   Be sensitive to the person's feelings about losing the ability to drive, but be firm in your request that he or she no longer do so. Be consistent. Do not allow the person to drive on "good days" but forbid it on "bad days."   Ask the doctor to help. The person may view the doctor as an "authority" and be willing to stop driving. The doctor can also contact the Department of Motor Vehicles and request that the person be reevaluated.   If necessary, take the car keys. If just having keys is important to the person, substitute a different set of keys.   If all else fails, disable the car or move it to a location where the person cannot see it or gain access to it.  VISITING THE DOCTOR It is important that the person with AD receive regular medical care. Advance planning can help the trip to the caregiver's office go more smoothly.   Try to schedule the appointment for the person's best time of day. Also, ask the office staff what time of day the office is least crowded.   Let the office staff know in advance that this person is confused. If there is something they might be able to do to make the visit go more smoothly, ask.   Do not tell the person about the appointment until the day of the visit or even shortly before it is time to go. Be positive and matter-of-fact.   Bring along something for the person to eat and drink and any activity that he or she may enjoy.   Have a friend or another family member go with you on the trip, so that one of you can be with the person while the other speaks with the doctor.  COPING WITH HOLIDAYS Holidays are bittersweet for many AD caregivers. The happy memories of the past contrast with the difficulties of the present, and extra demands on time and energy can seem overwhelming. Finding a  balance between rest and activity can help.  Keep or adapt family traditions that are important to you. Include the person with AD as much as possible.   Recognize that things will be different, and have realistic expectations about what you can do.   Encourage friends and family to visit. Limit the number of visitors at one time, and try to schedule visits during the time of day when the person is at his or her best.   Avoid crowds, changes in routine, and strange surroundings that may cause confusion or agitation.   Do your best to enjoy yourself. Try to find time for the holiday things you like to do, even if it means asking a friend or family member to spend time with the person while you are out.  VISITING A PERSON WITH AD Visitors are important to people with AD. They may not always remember who the visitors are, but just the human connection has value. Here are some ideas to share with someone who is planning to visit a person with AD.  Plan the visit at the time of day when the person is at his or her best. Consider bringing along some kind of activity, such as  something familiar to read or photo albums to look at, but be prepared to skip it, if necessary.   Be calm and quiet. Avoid using a loud tone of voice or talking to the person as if he or she were a child. Respect the person's personal space and do not get too close.   Try to establish eye contact and call the person by name to get his or her attention. Remind the person who you are if he or she does not seem to recognize you.   If the person is confused, do not argue. Respond to the feelings you hear being communicated, and distract the person to a different topic, if necessary.   If the person does not recognize you, is unkind, or responds angrily, remember not to take it personally. He or she is reacting out of confusion.  CHOOSING A NURSING HOME For many caregivers, there comes a point when they are no longer able to take  care of their loved one at home. Choosing a residential care facility, like a nursing home or an assisted living facility, is a big decision, and it can be hard to know where to start.   It is helpful to gather information about services and options before the need actually arises. This gives you time to explore all the possibilities fully, before making a decision.   Determine what facilities are in your area. Caregivers, friends, relatives, hospital social workers, and religious organizations may be able to help you identify specific facilities.   Make a list of questions you would like to ask the staff. Think about what is important to you, such as activity programs, transportation, or special units for people with AD.   Contact the places that interest you and make an appointment to visit. Talk to the administration, nursing staff, and residents.   Observe the way the facility runs and how residents are treated. You may want to drop by again unannounced, to see if your impressions are the same.   Find out what kinds of programs and services are offered for people with AD and their families. Ask about staff training in dementia care, and check to see what the policy is about family participation in planning patient care.   Check on room availability, cost, method of payment, and participation in Medicare or Medicaid. You may want to place your name on a waiting list, even if you are not ready to make an immediate decision about long-term care.   Once you have made a decision, be sure you understand the terms of the contract and financial agreement. You may want to have a lawyer review the documents with you before signing.   Moving is a big change for both the person with AD and the caregiver. A social worker may be able to help you plan for, and adjust to, the move. It is important to have support during this difficult transition.  FOR MORE INFORMATION  Alzheimer's Association:  Limit[employer]hu

## 2012-05-18 NOTE — ED Notes (Signed)
In and Out Cath NOT necessary. Charted in error

## 2012-05-18 NOTE — ED Notes (Signed)
Pt drinking fluids easily without distress and awaiting discharge paperwork.  Nurse had already called report to [facility]

## 2012-05-18 NOTE — ED Notes (Signed)
House coverage coming to speak with family regarding discharge situation.  Dr. Dierdre Highman aware

## 2012-05-18 NOTE — ED Provider Notes (Signed)
Patient care assumed from Dr. Golda Acre pending urinalysis. Patient with history of Alzheimer's and a Alzheimer dementia unit at nursing facility with episode of altered mental status concerning for a family bedside. The workup otherwise does not suggest etiology for symptoms. Patient reportedly now baseline.   Urinalysis reviewed as below.  Results for orders placed during the hospital encounter of 05/17/12  CBC      Component Value Range   WBC 7.1  4.0 - 10.5 K/uL   RBC 4.15 (*) 4.22 - 5.81 MIL/uL   Hemoglobin 12.9 (*) 13.0 - 17.0 g/dL   HCT 04.5 (*) 40.9 - 81.1 %   MCV 92.8  78.0 - 100.0 fL   MCH 31.1  26.0 - 34.0 pg   MCHC 33.5  30.0 - 36.0 g/dL   RDW 91.4  78.2 - 95.6 %   Platelets 235  150 - 400 K/uL  DIFFERENTIAL      Component Value Range   Neutrophils Relative 54  43 - 77 %   Neutro Abs 3.9  1.7 - 7.7 K/uL   Lymphocytes Relative 35  12 - 46 %   Lymphs Abs 2.5  0.7 - 4.0 K/uL   Monocytes Relative 9  3 - 12 %   Monocytes Absolute 0.6  0.1 - 1.0 K/uL   Eosinophils Relative 2  0 - 5 %   Eosinophils Absolute 0.1  0.0 - 0.7 K/uL   Basophils Relative 0  0 - 1 %   Basophils Absolute 0.0  0.0 - 0.1 K/uL  BASIC METABOLIC PANEL      Component Value Range   Sodium 141  135 - 145 mEq/L   Potassium 4.0  3.5 - 5.1 mEq/L   Chloride 103  96 - 112 mEq/L   CO2 27  19 - 32 mEq/L   Glucose, Bld 89  70 - 99 mg/dL   BUN 12  6 - 23 mg/dL   Creatinine, Ser 2.13  0.50 - 1.35 mg/dL   Calcium 9.4  8.4 - 08.6 mg/dL   GFR calc non Af Amer 70 (*) >90 mL/min   GFR calc Af Amer 82 (*) >90 mL/min  URINALYSIS, ROUTINE W REFLEX MICROSCOPIC      Component Value Range   Color, Urine YELLOW  YELLOW   APPearance CLEAR  CLEAR   Specific Gravity, Urine 1.008  1.005 - 1.030   pH 7.5  5.0 - 8.0   Glucose, UA NEGATIVE  NEGATIVE mg/dL   Hgb urine dipstick NEGATIVE  NEGATIVE   Bilirubin Urine NEGATIVE  NEGATIVE   Ketones, ur NEGATIVE  NEGATIVE mg/dL   Protein, ur NEGATIVE  NEGATIVE mg/dL   Urobilinogen, UA  0.2  0.0 - 1.0 mg/dL   Nitrite NEGATIVE  NEGATIVE   Leukocytes, UA NEGATIVE  NEGATIVE   Dg Chest 2 View  05/17/2012  *RADIOLOGY REPORT*  Clinical Data: Altered mental status.  CHEST - 2 VIEW  Comparison: 08/21/2011  Findings: Stable appearance of postoperative changes in the mediastinum.  Borderline heart size with normal pulmonary vascularity.  Calcified and tortuous aorta.  No focal airspace consolidation in the lungs.  No blunting of costophrenic angles. Multiple old right rib fractures and old left clavicular fracture. Stable appearance since previous study.  IMPRESSION: No evidence of active pulmonary disease.  Original Report Authenticated By: Marlon Pel, M.D.   Dg Knee 2 Views Left  05/17/2012  *RADIOLOGY REPORT*  Clinical Data: Medial knee pain.  No known injury.  LEFT KNEE - 1-2 VIEW  Comparison: None.  Findings: Degenerative changes in the left knee with medial and lateral compartment narrowing and mild hypertrophic changes. Cartilaginous calcification in the medial and lateral compartment consistent with chondrocalcinosis.  No evidence of acute fracture or subluxation.  No focal bone lesion or bone destruction.  No significant effusion.  Vascular calcifications.  IMPRESSION: Degenerative changes in the left knee with chondrocalcinosis.  No acute bony abnormalities.  Original Report Authenticated By: Marlon Pel, M.D.   I had a long discussion with family bedside- are comfortable taking him back to [facility] but requesting something for his mood. They're also concerned that he is no longer taking one of his mood medications. He has been in a nursing facility since February. Dr. Anne Hahn with neurology prescribed him medication that is no longer on his list the family has reviewed from the facility. Plan close primary care followup for review of medications and recheck. Stable for discharge to [facility] without indication for admit at this time.  Sunnie Nielsen, MD 05/18/12  [id number]

## 2012-05-18 NOTE — ED Notes (Signed)
Called and updated Toni Amend at [facility] and informed them that patient would be coming back

## 2012-05-18 NOTE — ED Notes (Signed)
Dr. Opitz at bedside. 

## 2012-09-20 IMAGING — CR DG CHEST 2V
2 series · 2 of 2 positions shown · non-contrast
Comparison: Chest radiograph performed 01/03/2011

CLINICAL DATA: Confusion and fall; weakness.

CHEST - 2 VIEW

[w chest pa]
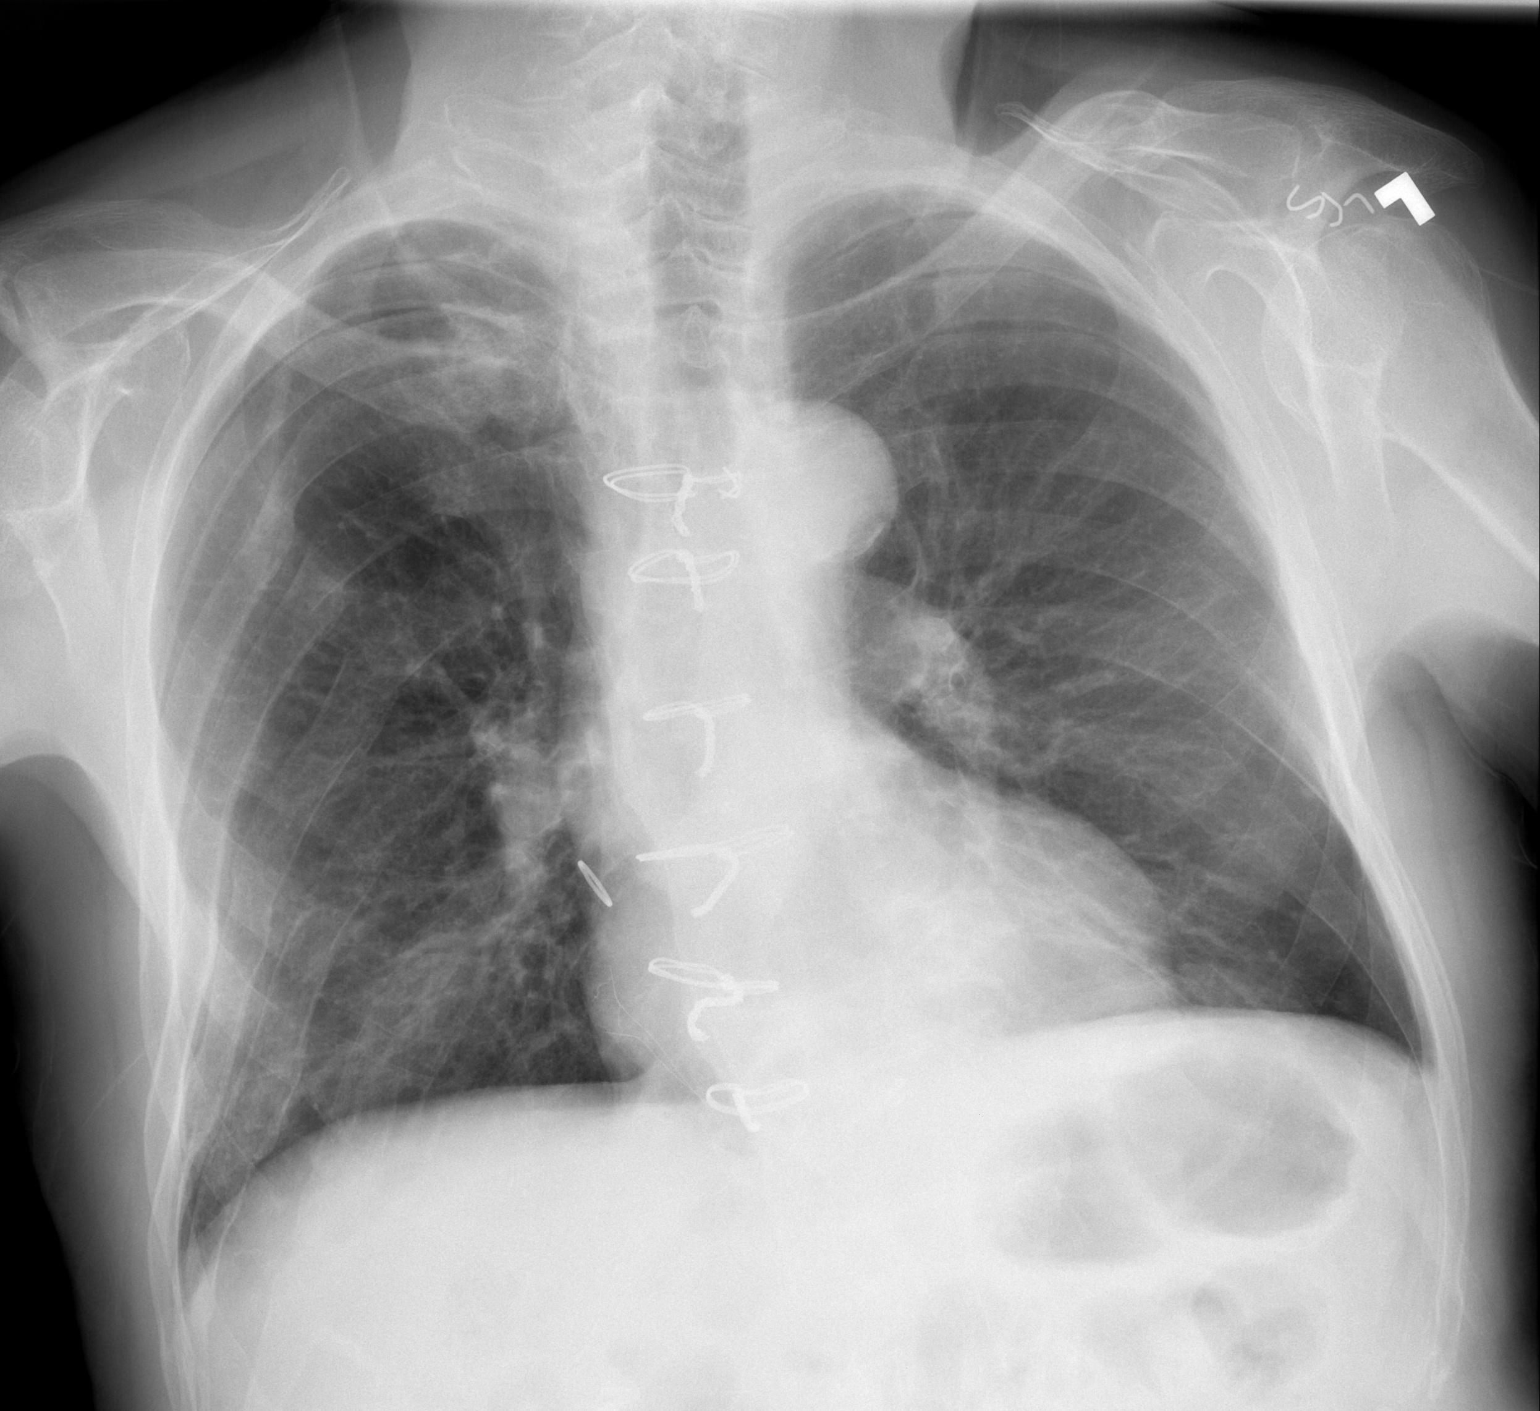

[w chest lat]
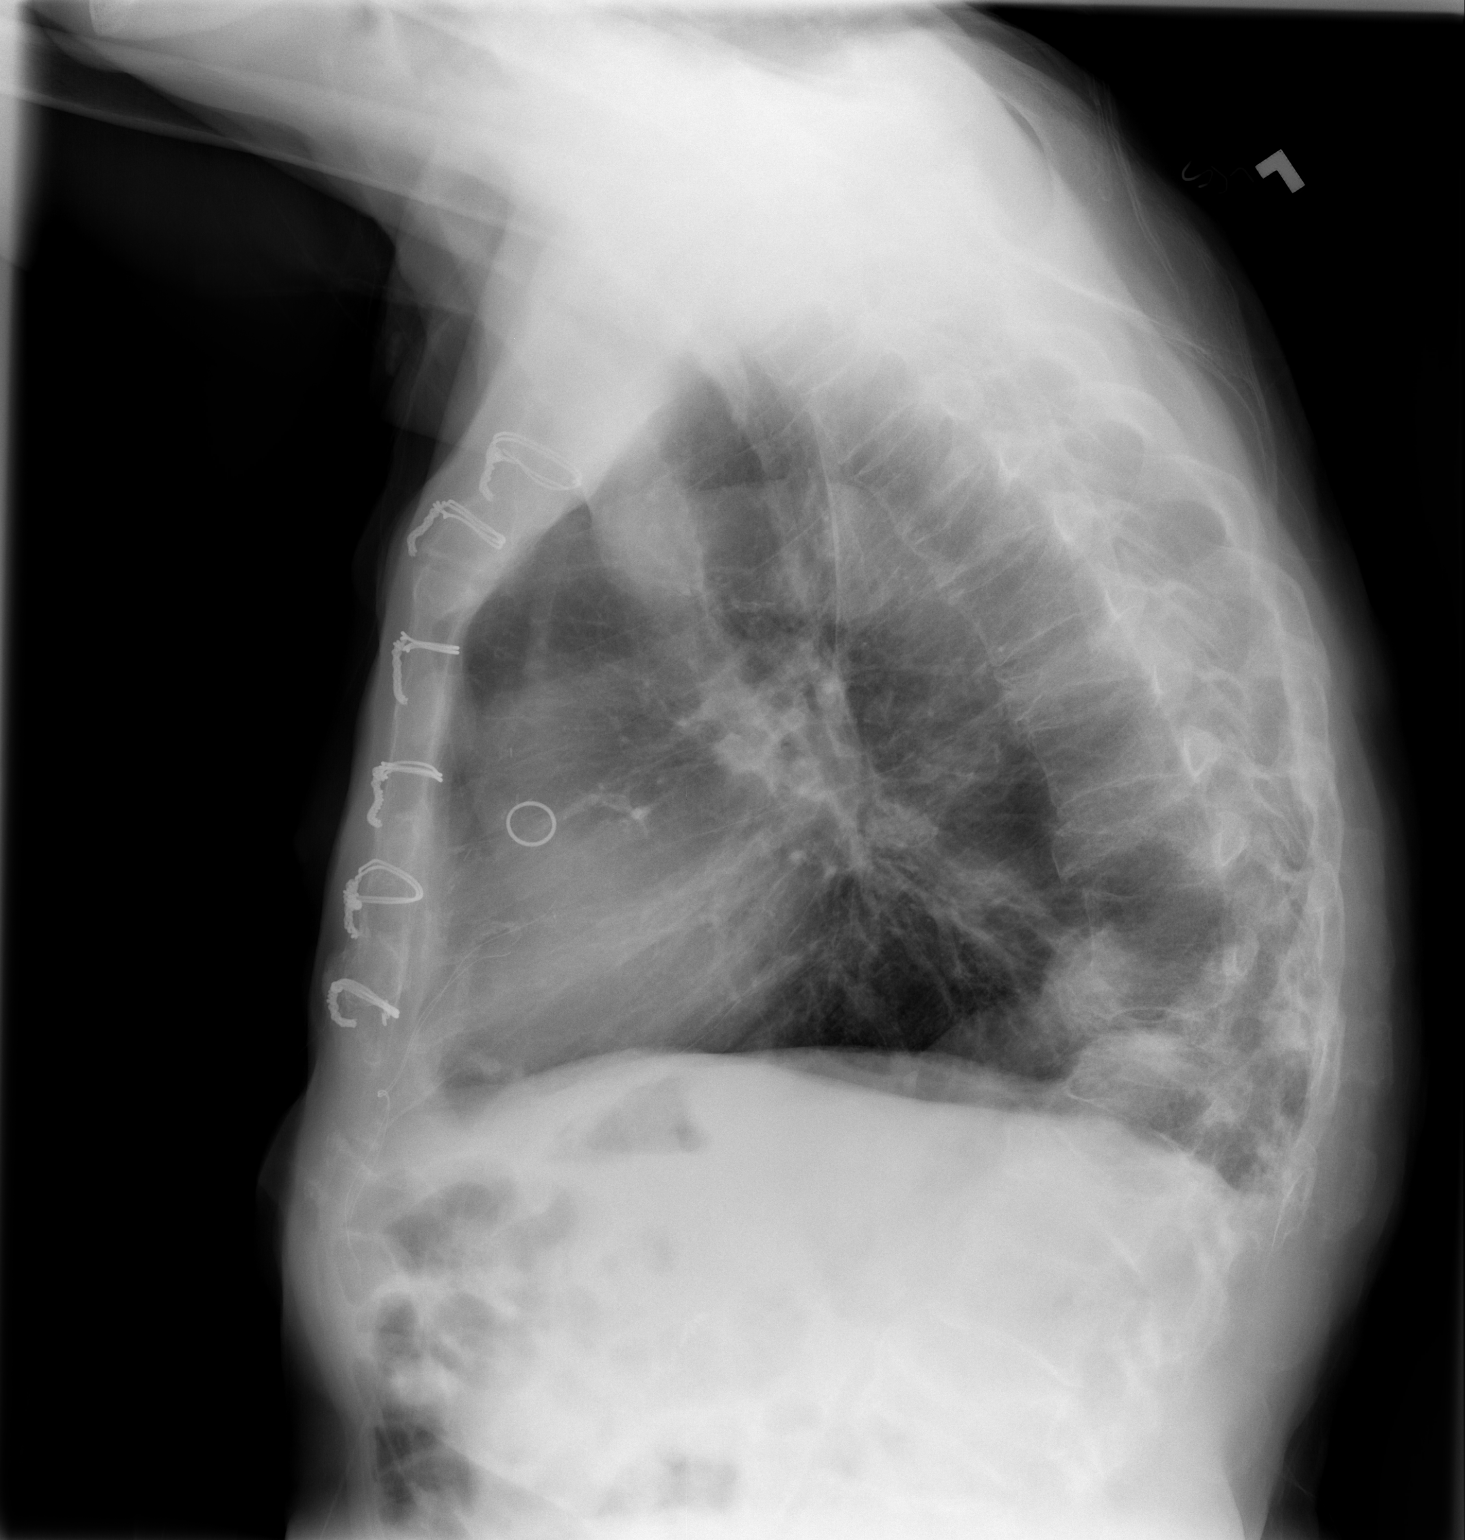

[2 of 2 positions shown; findings below may reference images not displayed]

FINDINGS: The lungs are well-aerated.  There is suggestion of lower
lobe opacity on the lateral view, possibly within the left lower
lobe, raising concern for mild pneumonia.  There is no evidence of
pleural effusion or pneumothorax.

The heart is normal in size; the patient is status post median
sternotomy.  Calcification is noted within the aortic arch.  No
acute osseous abnormalities are seen.  Multiple chronic right-sided
rib deformities are again seen.  There has been mild interval
progression of multiple compression deformities along the thoracic
and upper lumbar spine.
IMPRESSION: 1.  Suspect mild left lower lobe pneumonia.
2.  No acute displaced rib fractures seen.
3.  Mild interval progression of multiple compression deformities
along the thoracic and upper lumbar spine.

## 2013-01-18 DIAGNOSIS — R413 Other amnesia: Secondary | ICD-10-CM | POA: Insufficient documentation

## 2013-01-18 DIAGNOSIS — R269 Unspecified abnormalities of gait and mobility: Secondary | ICD-10-CM | POA: Insufficient documentation

## 2013-01-18 DIAGNOSIS — R6889 Other general symptoms and signs: Secondary | ICD-10-CM | POA: Insufficient documentation

## 2013-01-18 DIAGNOSIS — Z5181 Encounter for therapeutic drug level monitoring: Secondary | ICD-10-CM | POA: Insufficient documentation

## 2013-01-18 DIAGNOSIS — R519 Headache, unspecified: Secondary | ICD-10-CM | POA: Insufficient documentation

## 2013-01-18 DIAGNOSIS — R51 Headache: Secondary | ICD-10-CM | POA: Insufficient documentation

## 2013-01-18 DIAGNOSIS — G40319 Generalized idiopathic epilepsy and epileptic syndromes, intractable, without status epilepticus: Secondary | ICD-10-CM | POA: Insufficient documentation

## 2013-03-27 ENCOUNTER — Telehealth: Payer: Self-pay | Admitting: Neurology

## 2013-03-27 NOTE — Telephone Encounter (Signed)
I called and I left a message. I am not sure exactly what medication they are talking about that needs to be changed. They are to call him back on my cell phone regarding this patient.

## 2013-03-28 NOTE — Telephone Encounter (Signed)
I talked with hand today. I will fax an order to change the Celexa taking 60 mg from the morning to the evening around 6 PM. The patient is having some issues with anxiety in the evening. The orders will be faxed to [phone].

## 2013-07-17 ENCOUNTER — Encounter: Payer: Self-pay | Admitting: Neurology

## 2013-07-17 ENCOUNTER — Ambulatory Visit: Payer: Self-pay | Admitting: Nurse Practitioner

## 2013-07-17 DIAGNOSIS — R51 Headache: Secondary | ICD-10-CM

## 2013-07-17 DIAGNOSIS — G40319 Generalized idiopathic epilepsy and epileptic syndromes, intractable, without status epilepticus: Secondary | ICD-10-CM

## 2013-07-17 DIAGNOSIS — R6889 Other general symptoms and signs: Secondary | ICD-10-CM

## 2013-07-17 DIAGNOSIS — R413 Other amnesia: Secondary | ICD-10-CM

## 2013-07-17 DIAGNOSIS — R269 Unspecified abnormalities of gait and mobility: Secondary | ICD-10-CM

## 2013-07-17 DIAGNOSIS — Z5181 Encounter for therapeutic drug level monitoring: Secondary | ICD-10-CM

## 2013-07-18 ENCOUNTER — Ambulatory Visit (INDEPENDENT_AMBULATORY_CARE_PROVIDER_SITE_OTHER): Payer: Medicare Other | Admitting: Neurology

## 2013-07-18 ENCOUNTER — Encounter: Payer: Self-pay | Admitting: Neurology

## 2013-07-18 VITALS — BP 121/65 | HR 58 | Wt 158.0 lb

## 2013-07-18 DIAGNOSIS — R269 Unspecified abnormalities of gait and mobility: Secondary | ICD-10-CM

## 2013-07-18 DIAGNOSIS — G40319 Generalized idiopathic epilepsy and epileptic syndromes, intractable, without status epilepticus: Secondary | ICD-10-CM

## 2013-07-18 DIAGNOSIS — R413 Other amnesia: Secondary | ICD-10-CM

## 2013-07-18 MED ORDER — CLONAZEPAM 0.5 MG PO TABS: ORAL_TABLET | ORAL | Status: AC

## 2013-07-18 NOTE — Progress Notes (Signed)
Reason for visit: Seizures  Carl Hamilton is an 78 y.o. male  History of present illness:  Carl Hamilton is in 78 year old right-handed white male with a history of schizoaffective disorder, gait disorder, seizures, and a progressive memory disorder. The patient is now living in an extended care facility, and he appears to have problems with staying up all night long, and then sleeping during the day. The patient does not eat breakfast regularly because of this. The patient occasionally will have some agitation. The patient has had ongoing progression of his memory problems. The patient continues to fall on a regular basis. The patient uses a walker for ambulation. No further seizures have been reported. The patient is on Lyrica.  Past Medical History  Diagnosis Date  . Alzheimer disease   . Hypertension   . Osteoarthritis   . Epilepsia   . Parkinson disease   . Stroke     Left frontotemporal  . History of seizures   . Schizoaffective disorder   . Dyslipidemia   . CAD (coronary artery disease)   . History of strabismus   . Gait disorder   . Chronic low back pain     Compression fractures  . Tardive dyskinesia   . Personal history of subdural hematoma   . Rib fractures   . Ankle fracture, left   . Osteoporosis     Past Surgical History  Procedure Laterality Date  . Cardiac surgery    . Ankle fracture surgery    . Gallbladder surgery    . Transurethral resection of prostate    . Hernia repair    . Odontoid fracture surgery      Family History  Problem Relation Age of Onset  . Cancer Mother   . Cancer Father   . Heart attack Brother     Social history:  reports that he has never smoked. He has never used smokeless tobacco. He reports that he does not drink alcohol or use illicit drugs.    Allergies  Allergen Reactions  . Dilantin [Phenytoin Sodium Extended] Other (See Comments)    Family states causes bleeding on the brain    Medications:  Current  Outpatient Prescriptions on File Prior to Visit  Medication Sig Dispense Refill  . alendronate (FOSAMAX) 70 MG tablet Take 70 mg by mouth every 7 (seven) days. On Monday. Take with a full glass of water on an empty stomach.      Marland Kitchen aspirin EC 81 MG tablet Take 81 mg by mouth daily.      Marland Kitchen atorvastatin (LIPITOR) 20 MG tablet Take 20 mg by mouth at bedtime.      . calcium-vitamin D (OSCAL WITH D) 500-200 MG-UNIT per tablet Take 1 tablet by mouth 2 (two) times daily.      . citalopram (CELEXA) 40 MG tablet Take 60 mg by mouth daily.      . cyanocobalamin (,VITAMIN B-12,) 1000 MCG/ML injection Inject 1,000 mcg into the muscle every 30 (thirty) days.      Marland Kitchen docusate sodium (COLACE) 100 MG capsule Take 200 mg by mouth at bedtime.       Marland Kitchen ezetimibe (ZETIA) 10 MG tablet Take 10 mg by mouth daily.      Marland Kitchen lamoTRIgine (LAMICTAL) 200 MG tablet Take 200 mg by mouth 2 (two) times daily.      . magnesium hydroxide (MILK OF MAGNESIA) 400 MG/5ML suspension Take 30 mLs by mouth daily as needed. For constipation      .  nitroGLYCERIN (NITROSTAT) 0.4 MG SL tablet Place 0.4 mg under the tongue every 5 (five) minutes as needed. For chest pain      . oxyCODONE (OXY IR/ROXICODONE) 5 MG immediate release tablet Take 5 mg by mouth every 6 (six) hours as needed. For pain      . pantoprazole (PROTONIX) 40 MG tablet Take 40 mg by mouth daily.      Marland Kitchen perphenazine (TRILAFON) 4 MG tablet Take 4 mg by mouth at bedtime.      . pregabalin (LYRICA) 100 MG capsule Take 100-200 mg by mouth daily. 1 capsule in the morning and 2 capsules at night       No current facility-administered medications on file prior to visit.    ROS:  Out of a complete 14 system review of symptoms, the patient complains only of the following symptoms, and all other reviewed systems are negative.  Constipation Memory problems Gait disorder Depression, not enough sleep  Blood pressure 121/65, pulse 58, weight 158 lb (71.668 kg).  Physical  Exam  General: The patient is alert and cooperative at the time of the examination.  Neuromuscular: The patient is quite kyphotic, with sitting, he leans to the right.  Skin: No significant peripheral edema is noted.   Neurologic Exam  Mental status: Mini-Mental status examination done today shows a total score of 13/30.  Cranial nerves: Facial symmetry is present. Speech is normal, no aphasia or dysarthria is noted. Extraocular movements are full. Visual fields are full.  Motor: The patient has good strength in all 4 extremities.  Coordination: The patient has good finger-nose-finger and heel-to-shin bilaterally.  Gait and station: The patient has a wide-based, unsteady gait. The patient walks with a walker. Tandem gait was not attempted. Romberg is positive. No drift is seen.  Reflexes: Deep tendon reflexes are symmetric.   Assessment/Plan:  1. Schizoaffective disorder  2. Gait disorder  3. Memory disorder  4. History of seizures  The patient continues to be unstable on his feet. A prescription was given for a wheelchair. The patient will be increased on the clonazepam to a 0.5 mg tablet in the morning, 1.0 mg in the evening to help him sleep. The patient will followup in 6 months.  Marlan Palau MD 07/18/2013 8:06 PM  [facility] [address] [place], [place] [address]  Phone [phone] Fax [phone]

## 2013-10-16 ENCOUNTER — Telehealth: Payer: Self-pay | Admitting: Neurology

## 2013-10-16 NOTE — Telephone Encounter (Signed)
The extended care facility contacted [place], indicating that the patient is having increased anxiety and agitation. Haldol dosing will be increased to 1 mg every 6 hours if needed.

## 2013-12-18 ENCOUNTER — Telehealth: Payer: Self-pay | Admitting: *Deleted

## 2013-12-18 NOTE — Telephone Encounter (Signed)
Patient's wife calling about Mr. Carl Hamilton. She says he has had over 6 falls. He was taken to the hospital over the weekend. She says this has happened in the past and you had to switch meds around. Please give Vedia PereyraJoyce Chaudoin a call @ [phone].

## 2013-12-18 NOTE — Telephone Encounter (Signed)
I called the wife. The patient has fallen 6 times within the last week. It is not clear whether any medication changes have been made. I will get the patient into the office for revisit.

## 2013-12-19 ENCOUNTER — Telehealth: Payer: Self-pay | Admitting: Neurology

## 2013-12-19 NOTE — Telephone Encounter (Signed)
error 

## 2013-12-19 NOTE — Telephone Encounter (Signed)
Patient has appt already on 12/22/13 at 3:30 pm.

## 2013-12-19 NOTE — Telephone Encounter (Signed)
Patient's wife returning call about scheduling follow up visit with Dr. Anne HahnWillis this week. Please call patient back to schedule.

## 2013-12-22 ENCOUNTER — Encounter (INDEPENDENT_AMBULATORY_CARE_PROVIDER_SITE_OTHER): Payer: Self-pay

## 2013-12-22 ENCOUNTER — Encounter: Payer: Self-pay | Admitting: Neurology

## 2013-12-22 ENCOUNTER — Ambulatory Visit (INDEPENDENT_AMBULATORY_CARE_PROVIDER_SITE_OTHER): Payer: Medicare Other | Admitting: Neurology

## 2013-12-22 VITALS — BP 138/73 | HR 70 | Wt 166.0 lb

## 2013-12-22 DIAGNOSIS — R269 Unspecified abnormalities of gait and mobility: Secondary | ICD-10-CM

## 2013-12-22 DIAGNOSIS — R413 Other amnesia: Secondary | ICD-10-CM

## 2013-12-22 NOTE — Progress Notes (Signed)
Reason for visit: Gait disorder, seizures  Carl Hamilton is an 79 y.o. male  History of present illness:  Carl Hamilton is a 79 year old right-handed white male with a history of seizures, dementia, and a schizoaffective disorder. The patient has been on anti-psychotic medications such as perphenazine. The patient is on Lyrica and lamotrigine. The patient has had a chronic gait disorder, but this problem has gradually worsened over time. The patient uses a walker for ambulation, but he does not consistently use this in part secondary to his dementia. The patient has periods of agitation that may occur intermittently, and the patient may be more likely to fall during these periods of time. The patient is in an assisted living situation, but he requires assistance with bathing and dressing, and he requires thickened liquids with his diet. The patient has fallen 5 or 6 times within the last week. Fortunately, the patient has not sustained a severe injury. The patient denies headache, or new numbness or weakness of the extremities.  Past Medical History  Diagnosis Date  . Alzheimer disease   . Hypertension   . Osteoarthritis   . Epilepsia   . Parkinson disease   . Stroke     Left frontotemporal  . History of seizures   . Schizoaffective disorder   . Dyslipidemia   . CAD (coronary artery disease)   . History of strabismus   . Gait disorder   . Chronic low back pain     Compression fractures  . Tardive dyskinesia   . Personal history of subdural hematoma   . Rib fractures   . Ankle fracture, left   . Osteoporosis     Past Surgical History  Procedure Laterality Date  . Cardiac surgery    . Ankle fracture surgery    . Gallbladder surgery    . Transurethral resection of prostate    . Hernia repair    . Odontoid fracture surgery      Family History  Problem Relation Age of Onset  . Cancer Mother   . Cancer Father   . Heart attack Brother     Social history:  reports that  he has never smoked. He has never used smokeless tobacco. He reports that he does not drink alcohol or use illicit drugs.    Allergies  Allergen Reactions  . Dilantin [Phenytoin Sodium Extended] Other (See Comments)    Family states causes bleeding on the brain    Medications:  Current Outpatient Prescriptions on File Prior to Visit  Medication Sig Dispense Refill  . alendronate (FOSAMAX) 70 MG tablet Take 70 mg by mouth every 7 (seven) days. On [place]. Take with a full glass of water on an empty stomach.      Marland Kitchen aspirin EC 81 MG tablet Take 81 mg by mouth daily.      Marland Kitchen atorvastatin (LIPITOR) 20 MG tablet Take 20 mg by mouth at bedtime.      . calcium-vitamin D (OSCAL WITH D) 500-200 MG-UNIT per tablet Take 1 tablet by mouth 2 (two) times daily.      . citalopram (CELEXA) 40 MG tablet Take 60 mg by mouth daily.      . clonazePAM (KLONOPIN) 0.5 MG tablet 1 tablet in the morning, 2 tablets in the evening  30 tablet    . cyanocobalamin (,VITAMIN B-12,) 1000 MCG/ML injection Inject 1,000 mcg into the muscle every 30 (thirty) days.      Marland Kitchen docusate sodium (COLACE) 100 MG  capsule Take 200 mg by mouth at bedtime.       Marland Kitchen. ezetimibe (ZETIA) 10 MG tablet Take 10 mg by mouth daily.      . haloperidol (HALDOL) 1 MG tablet Take 1 mg by mouth every 6 (six) hours as needed.       . lamoTRIgine (LAMICTAL) 200 MG tablet Take 200 mg by mouth 2 (two) times daily.      Marland Kitchen. LORazepam (ATIVAN) 0.5 MG tablet Take 0.5 mg by mouth every 8 (eight) hours as needed for anxiety.      . magnesium hydroxide (MILK OF MAGNESIA) 400 MG/5ML suspension Take 30 mLs by mouth daily as needed. For constipation      . Memantine HCl ER (NAMENDA XR) 28 MG CP24 Take 28 mg by mouth daily.      . metoprolol succinate (TOPROL-XL) 50 MG 24 hr tablet Take 50 mg by mouth daily. Take with or immediately following a meal.      . nitroGLYCERIN (NITROSTAT) 0.4 MG SL tablet Place 0.4 mg under the tongue every 5 (five) minutes as needed. For chest  pain      . oxyCODONE (OXY IR/ROXICODONE) 5 MG immediate release tablet Take 5 mg by mouth every 6 (six) hours as needed. For pain      . pantoprazole (PROTONIX) 40 MG tablet Take 40 mg by mouth daily.      Marland Kitchen. perphenazine (TRILAFON) 4 MG tablet Take 4 mg by mouth at bedtime.      . polyethylene glycol (MIRALAX / GLYCOLAX) packet Take 17 g by mouth daily.      . pregabalin (LYRICA) 100 MG capsule Take 100 mg by mouth 2 (two) times daily.        No current facility-administered medications on file prior to visit.    ROS:  Out of a complete 14 system review of symptoms, the patient complains only of the following symptoms, and all other reviewed systems are negative.  Difficulty swallowing Frequent waking Walking difficulty, coordination problem Memory loss Agitation, behavior problems  Blood pressure 138/73, pulse 70, weight 166 lb (75.297 kg).  Physical Exam  General: The patient is alert and cooperative at the time of the examination.  Neuromuscular: The patient has severe kyphosis and scoliosis.  Skin: No significant peripheral edema is noted.   Neurologic Exam  Mental status: The Mini-Mental status examination done today shows a total score of 21/30.  Cranial nerves: Facial symmetry is present. Speech is normal, no aphasia or dysarthria is noted. Extraocular movements are full. Visual fields are full.  Motor: The patient has good strength in all 4 extremities.  Sensory examination: Soft touch sensation is symmetric on the face, arms, and legs.  Coordination: The patient has good finger-nose-finger and heel-to-shin bilaterally.  Gait and station: The patient has some difficulty with standing up. Once up, the patient demonstrates good stability with using a walker, with turns. Without a walker, the patient has inability to stand upright, he will fall backwards. Tandem gait was not attempted.  Reflexes: Deep tendon reflexes are symmetric.   Assessment/Plan:  1.  History seizures  2. Gait disorder  3. Dementia  4. Schizoaffective disorder  The patient has a dangerous combination of psychiatric illness, memory issues, and a severe gait disorder. The patient has poor judgment, and he has wide variations in his affect, at times he is quite affable, and other times quite agitated. The safety issues with this gentleman are very difficult to control. A bed alarm was  placed, but the patient will remove the alarm. The patient cannot remember to use his walker consistently, and when he tries ambulate without a walker, he will fall. The patient may require a skilled level nursing facility. Medications such as Lyrica may worsen gait stability. The patient is also on benzodiazepines such as clonazepam and Ativan, and he is on medications that may produce secondary parkinsonism such as perphenazine and occasionally Haldol. At this point, I will cut back on Lyrica taking 100 mg twice daily. I am not sure the risk of falling will be easily corrected short of restraining the patient. The patient will followup in 4 months.  Marlan Palau MD 12/22/2013 7:07 PM  [facility] [address] [place], [place] [address]  Phone [phone] Fax [phone]

## 2013-12-22 NOTE — Patient Instructions (Signed)

## 2014-02-06 ENCOUNTER — Ambulatory Visit: Payer: Medicare Other | Admitting: Neurology

## 2014-04-25 ENCOUNTER — Ambulatory Visit: Payer: Medicare Other | Admitting: Nurse Practitioner

## 2014-06-28 NOTE — Telephone Encounter (Signed)
Noted  

## 2014-10-10 ENCOUNTER — Encounter: Payer: Self-pay | Admitting: Neurology

## 2014-10-16 ENCOUNTER — Encounter: Payer: Self-pay | Admitting: Neurology

## 2020-09-23 DEATH — deceased
